# Patient Record
Sex: Male | Born: 1980 | Hispanic: Yes | Marital: Married | Smoking: Current some day smoker
Health system: Southern US, Community
[De-identification: ages and names within clinical notes are randomized; demographics above are authoritative.]

## PROBLEM LIST (undated history)

## (undated) DIAGNOSIS — E079 Disorder of thyroid, unspecified: Secondary | ICD-10-CM

## (undated) DIAGNOSIS — J45909 Unspecified asthma, uncomplicated: Secondary | ICD-10-CM

## (undated) HISTORY — DX: Disorder of thyroid, unspecified: E07.9

## (undated) HISTORY — PX: PARATHYROIDECTOMY: SHX19

## (undated) HISTORY — DX: Unspecified asthma, uncomplicated: J45.909

## (undated) HISTORY — PX: OTHER SURGICAL HISTORY: SHX169

---

## 2017-02-02 ENCOUNTER — Ambulatory Visit: Payer: Self-pay | Admitting: Family Medicine

## 2017-03-22 ENCOUNTER — Ambulatory Visit: Payer: Self-pay | Admitting: Family Medicine

## 2017-04-04 ENCOUNTER — Encounter: Payer: Self-pay | Admitting: Emergency Medicine

## 2017-04-04 ENCOUNTER — Other Ambulatory Visit: Payer: Self-pay

## 2017-04-04 ENCOUNTER — Emergency Department: Payer: BLUE CROSS/BLUE SHIELD

## 2017-04-04 ENCOUNTER — Emergency Department
Admission: EM | Admit: 2017-04-04 | Discharge: 2017-04-04 | Disposition: A | Discharge status: Home / Self Care | Payer: BLUE CROSS/BLUE SHIELD | Attending: Emergency Medicine | Admitting: Emergency Medicine

## 2017-04-04 ENCOUNTER — Telehealth: Payer: Self-pay | Admitting: Gastroenterology

## 2017-04-04 DIAGNOSIS — R1010 Upper abdominal pain, unspecified: Secondary | ICD-10-CM

## 2017-04-04 DIAGNOSIS — R509 Fever, unspecified: Secondary | ICD-10-CM | POA: Insufficient documentation

## 2017-04-04 DIAGNOSIS — K219 Gastro-esophageal reflux disease without esophagitis: Secondary | ICD-10-CM | POA: Diagnosis not present

## 2017-04-04 DIAGNOSIS — F17210 Nicotine dependence, cigarettes, uncomplicated: Secondary | ICD-10-CM | POA: Insufficient documentation

## 2017-04-04 DIAGNOSIS — R1013 Epigastric pain: Secondary | ICD-10-CM | POA: Insufficient documentation

## 2017-04-04 LAB — COMPREHENSIVE METABOLIC PANEL
ALT: 35 U/L (ref 17–63)
ANION GAP: 11 (ref 5–15)
AST: 25 U/L (ref 15–41)
Albumin: 4 g/dL (ref 3.5–5.0)
Alkaline Phosphatase: 59 U/L (ref 38–126)
BILIRUBIN TOTAL: 0.9 mg/dL (ref 0.3–1.2)
BUN: 14 mg/dL (ref 6–20)
CALCIUM: 8.7 mg/dL — AB (ref 8.9–10.3)
CO2: 22 mmol/L (ref 22–32)
Chloride: 103 mmol/L (ref 101–111)
Creatinine, Ser: 1 mg/dL (ref 0.61–1.24)
GFR calc non Af Amer: >60 mL/min (ref >60)
Glucose, Bld: 101 mg/dL — ABNORMAL HIGH (ref 65–99)
POTASSIUM: 3.7 mmol/L (ref 3.5–5.1)
SODIUM: 136 mmol/L (ref 135–145)
TOTAL PROTEIN: 7.4 g/dL (ref 6.5–8.1)

## 2017-04-04 LAB — CBC
HCT: 46.1 % (ref 40.0–52.0)
HEMOGLOBIN: 16 g/dL (ref 13.0–18.0)
MCH: 28 pg (ref 26.0–34.0)
MCHC: 34.6 g/dL (ref 32.0–36.0)
MCV: 81 fL (ref 80.0–100.0)
Platelets: 217 10*3/uL (ref 150–440)
RBC: 5.69 MIL/uL (ref 4.40–5.90)
RDW: 13.1 % (ref 11.5–14.5)
WBC: 5.2 10*3/uL (ref 3.8–10.6)

## 2017-04-04 LAB — TROPONIN I

## 2017-04-04 LAB — BILIRUBIN, DIRECT: BILIRUBIN DIRECT: 0.1 mg/dL (ref 0.1–0.5)

## 2017-04-04 LAB — LIPASE, BLOOD: Lipase: 35 U/L (ref 11–51)

## 2017-04-04 MED ORDER — MORPHINE SULFATE (PF) 4 MG/ML IV SOLN: 4.0000 mg | Freq: Once | INTRAVENOUS | Status: DC

## 2017-04-04 MED ORDER — IOPAMIDOL (ISOVUE-300) INJECTION 61%
125.0000 mL | Freq: Once | INTRAVENOUS | Status: AC | PRN
  Administered 2017-04-04: 125 mL via INTRAVENOUS

## 2017-04-04 MED ORDER — GI COCKTAIL ~~LOC~~
30.0000 mL | Freq: Once | ORAL | Status: AC
  Administered 2017-04-04: 30 mL via ORAL
  Filled 2017-04-04: qty 30

## 2017-04-04 MED ORDER — FAMOTIDINE IN NACL 20-0.9 MG/50ML-% IV SOLN
20.0000 mg | Freq: Once | INTRAVENOUS | Status: AC
  Administered 2017-04-04: 20 mg via INTRAVENOUS
  Filled 2017-04-04: qty 50

## 2017-04-04 MED ORDER — SODIUM CHLORIDE 0.9 % IV SOLN
Freq: Once | INTRAVENOUS | Status: AC
  Administered 2017-04-04: 10:00:00 via INTRAVENOUS

## 2017-04-04 MED ORDER — FAMOTIDINE 20 MG PO TABS: 20.0000 mg | ORAL_TABLET | Freq: Two times a day (BID) | ORAL | 1 refills | Status: DC

## 2017-04-04 MED ORDER — MORPHINE SULFATE (PF) 4 MG/ML IV SOLN
4.0000 mg | Freq: Once | INTRAVENOUS | Status: AC
  Administered 2017-04-04: 4 mg via INTRAVENOUS
  Filled 2017-04-04: qty 1

## 2017-04-04 MED ORDER — IOPAMIDOL (ISOVUE-300) INJECTION 61%
30.0000 mL | Freq: Once | INTRAVENOUS | Status: AC | PRN
  Administered 2017-04-04: 30 mL via ORAL

## 2017-04-04 MED ORDER — ONDANSETRON HCL 4 MG/2ML IJ SOLN
4.0000 mg | Freq: Once | INTRAMUSCULAR | Status: AC
Start: 2017-04-04 — End: 2017-04-04
  Administered 2017-04-04: 4 mg via INTRAVENOUS
  Filled 2017-04-04: qty 2

## 2017-04-04 MED ORDER — OXYCODONE-ACETAMINOPHEN 5-325 MG PO TABS: 1.0000 | ORAL_TABLET | Freq: Four times a day (QID) | ORAL | 0 refills | Status: DC | PRN

## 2017-04-04 NOTE — ED Provider Notes (Signed)
[facility] Emergency Department Provider Note       Time seen: ----------------------------------------- 9:50 AM on 04/04/2017 -----------------------------------------   I have reviewed the triage vital signs and the nursing notes.  HISTORY   Chief Complaint Abdominal Pain and Fever    HPI Dennis Gardner is a 37 y.o. male with a history of parathyroid excision who presents to the ED for gastric pain since yesterday.  Patient reports he had fever several days ago for which she was taking ibuprofen.  Temperature was noted to be up to 104 but it did not last very long.  It seemed to improve with oral antipyretics.  He began having epigastric pain yesterday.  Eating and drinking seems to make it worse.  He denies any upper respiratory symptoms, he denies any vomiting or diarrhea but does have some nausea.  History reviewed. No pertinent past medical history.  There are no active problems to display for this patient.   Past Surgical History:  Procedure Laterality Date  . parathyroid excision      Allergies Patient has no known allergies.  Social History Social History   Tobacco Use  . Smoking status: Current Some Day Smoker  Substance Use Topics  . Alcohol use: Not on file  . Drug use: Not on file    Review of Systems Constitutional: Positive for fever Cardiovascular: Negative for chest pain. Respiratory: Negative for shortness of breath. Gastrointestinal: Positive for abdominal pain, nausea Genitourinary: Negative for dysuria. Musculoskeletal: Negative for back pain. Skin: Negative for rash. Neurological: Negative for headaches, focal weakness or numbness.  All systems negative/normal/unremarkable except as stated in the HPI  ____________________________________________   PHYSICAL EXAM:  VITAL SIGNS: ED Triage Vitals  Enc Vitals Group     BP 04/04/17 0853 135/77     Pulse Rate 04/04/17 0853 86     Resp 04/04/17 0853 20     Temp  04/04/17 0853 99.2 F (37.3 C)     Temp Source 04/04/17 0853 Axillary     SpO2 04/04/17 0853 98 %     Weight 04/04/17 0853 278 lb (126.1 kg)     Height 04/04/17 0853 6\' 2"  (1.88 m)     Head Circumference --      Peak Flow --      Pain Score 04/04/17 0858 7     Pain Loc --      Pain Edu? --      Excl. in GC? --     Constitutional: Alert and oriented. Well appearing and in no distress. Eyes: Conjunctivae are normal. Normal extraocular movements. ENT   Head: Normocephalic and atraumatic.   Nose: No congestion/rhinnorhea.   Mouth/Throat: Mucous membranes are moist.   Neck: No stridor. Cardiovascular: Normal rate, regular rhythm. No murmurs, rubs, or gallops. Respiratory: Normal respiratory effort without tachypnea nor retractions. Breath sounds are clear and equal bilaterally. No wheezes/rales/rhonchi. Gastrointestinal: Epigastric tenderness, no rebound or guarding.  Normal bowel sounds. Musculoskeletal: Nontender with normal range of motion in extremities. No lower extremity tenderness nor edema. Neurologic:  Normal speech and language. No gross focal neurologic deficits are appreciated.  Skin:  Skin is warm, dry and intact. No rash noted. Psychiatric: Mood and affect are normal. Speech and behavior are normal.  ____________________________________________  ED COURSE:  As part of my medical decision making, I reviewed the following data within the electronic MEDICAL RECORD NUMBER History obtained from family if available, nursing notes, old chart and ekg, as well as notes from prior ED  visits. Patient presented for epigastric pain and recent fever, we will assess with labs and imaging as indicated at this time.   Procedures ____________________________________________   LABS (pertinent positives/negatives)  Labs Reviewed  COMPREHENSIVE METABOLIC PANEL - Abnormal; Notable for the following components:      Result Value   Glucose, Bld 101 (*)    Calcium 8.7 (*)    All  other components within normal limits  LIPASE, BLOOD  CBC  TROPONIN I  BILIRUBIN, DIRECT    RADIOLOGY Images were viewed by me  Abdomen 2 view/CT IMPRESSION: Nonspecific bowel gas pattern with a few mildly prominent central small bowel loops. This could be related to focal ileus or early small bowel obstruction.  IMPRESSION: Mild dilatation of the right ureter and right renal pelvis of uncertain etiology. Subtle density near the right ureterovesical junction but no definite stone.  Normal appendix. No acute bowel abnormality. ____________________________________________  DIFFERENTIAL DIAGNOSIS   GERD, peptic ulcer disease, gastroenteritis, influenza, dehydration  FINAL ASSESSMENT AND PLAN  Epigastric pain   Plan: Patient had presented for epigastric pain. Patient's labs were reassuring. Patient's imaging revealed possible recently passed kidney stone.  Patient has exhibited viral symptoms similar to influenza but recently was tested negative for this.  For his fever and symptoms he has taken NSAIDs which I think is exacerbating his GERD.  He was given a GI cocktail here as well as Pepcid, fluids and morphine.  No other etiology was identified on CT.  He does not have symptoms of a kidney stone.  Overall he is improved and stable for outpatient follow-up.   Ulice DashJohnathan E Emmogene Simson, MD   Note: This note was generated in part or whole with voice recognition software. Voice recognition is usually quite accurate but there are transcription errors that can and very often do occur. I apologize for any typographical errors that were not detected and corrected.     Emily FilbertWilliams, Raheen Capili E, MD 04/04/17 [phone]

## 2017-04-04 NOTE — Telephone Encounter (Signed)
Patient called to schedule an appointment with Dr Tobi BastosAnna being referred by the ED. He was offered  04-20-17 & declined. He stated this would not do him any good. I went by what the ED note stated.

## 2017-04-04 NOTE — ED Notes (Signed)
Patient reports not wanting any pain medicine at this time

## 2017-04-04 NOTE — ED Notes (Signed)
Patient transported to X-ray 

## 2017-04-04 NOTE — ED Notes (Signed)
Patient was seen at urgent care and tested negative for flu and unremarkable chest xray

## 2017-04-04 NOTE — ED Triage Notes (Signed)
Epigastric pain since yesterday. Fever x 2 days, temp 104.7 Saturday. Screen flu neg 2 days ago.

## 2017-04-06 ENCOUNTER — Emergency Department
Admission: EM | Admit: 2017-04-06 | Discharge: 2017-04-06 | Disposition: A | Discharge status: Home / Self Care | Payer: BLUE CROSS/BLUE SHIELD | Attending: Emergency Medicine | Admitting: Emergency Medicine

## 2017-04-06 ENCOUNTER — Encounter: Payer: Self-pay | Admitting: Emergency Medicine

## 2017-04-06 ENCOUNTER — Other Ambulatory Visit: Payer: Self-pay

## 2017-04-06 DIAGNOSIS — K922 Gastrointestinal hemorrhage, unspecified: Secondary | ICD-10-CM

## 2017-04-06 DIAGNOSIS — R1013 Epigastric pain: Secondary | ICD-10-CM

## 2017-04-06 DIAGNOSIS — F17210 Nicotine dependence, cigarettes, uncomplicated: Secondary | ICD-10-CM | POA: Diagnosis not present

## 2017-04-06 DIAGNOSIS — Z79899 Other long term (current) drug therapy: Secondary | ICD-10-CM | POA: Insufficient documentation

## 2017-04-06 LAB — COMPREHENSIVE METABOLIC PANEL
ALT: 55 U/L (ref 17–63)
ANION GAP: 10 (ref 5–15)
AST: 39 U/L (ref 15–41)
Albumin: 4.3 g/dL (ref 3.5–5.0)
Alkaline Phosphatase: 60 U/L (ref 38–126)
BUN: 13 mg/dL (ref 6–20)
CALCIUM: 9.3 mg/dL (ref 8.9–10.3)
CHLORIDE: 101 mmol/L (ref 101–111)
CO2: 26 mmol/L (ref 22–32)
Creatinine, Ser: 1 mg/dL (ref 0.61–1.24)
GFR calc non Af Amer: >60 mL/min (ref >60)
Glucose, Bld: 95 mg/dL (ref 65–99)
POTASSIUM: 3.9 mmol/L (ref 3.5–5.1)
SODIUM: 137 mmol/L (ref 135–145)
Total Bilirubin: 0.8 mg/dL (ref 0.3–1.2)
Total Protein: 7.6 g/dL (ref 6.5–8.1)

## 2017-04-06 LAB — CBC
HCT: 44.2 % (ref 40.0–52.0)
HEMOGLOBIN: 15.2 g/dL (ref 13.0–18.0)
MCH: 27.9 pg (ref 26.0–34.0)
MCHC: 34.5 g/dL (ref 32.0–36.0)
MCV: 81 fL (ref 80.0–100.0)
Platelets: 228 10*3/uL (ref 150–440)
RBC: 5.46 MIL/uL (ref 4.40–5.90)
RDW: 13.3 % (ref 11.5–14.5)
WBC: 5.4 10*3/uL (ref 3.8–10.6)

## 2017-04-06 LAB — TYPE AND SCREEN
ABO/RH(D): O POS
Antibody Screen: NEGATIVE

## 2017-04-06 MED ORDER — PANTOPRAZOLE SODIUM 40 MG PO TBEC: 40.0000 mg | DELAYED_RELEASE_TABLET | Freq: Every day | ORAL | 0 refills | Status: DC

## 2017-04-06 NOTE — ED Provider Notes (Signed)
[facility] Emergency Department Provider Note  ____________________________________________   First MD Initiated Contact with Patient 04/06/17 1551     (approximate)  I have reviewed the triage vital signs and the nursing notes.   HISTORY  Chief Complaint Melena   HPI Dennis Gardner is a 37 y.o. male is self presents the emergency department with roughly 4-5 days of cramping moderate severity upper abdominal pain associated with nausea.  He was seen in our emergency department 2 days ago where he had unremarkable blood work and a normal CT scan.  His symptoms were felt to be gastric in etiology and he was prescribed famotidine and given GI follow-up.  The patient notes that his famotidine is not helped.  He is frustrated and today he had a small amount of dark black stool.  His GI follow-up is not for another 2 weeks.  His pain is cramping upper abdominal.  Seems to be somewhat worse after food somewhat improved when not eating.  Is associated with nausea.  It is nonradiating.  History reviewed. No pertinent past medical history.  There are no active problems to display for this patient.   Past Surgical History:  Procedure Laterality Date  . parathyroid excision    . PARATHYROIDECTOMY      Prior to Admission medications   Medication Sig Start Date End Date Taking? Authorizing Provider  acetaminophen (TYLENOL) 325 MG tablet Take 650 mg by mouth every 6 (six) hours as needed.    [provider]  famotidine (PEPCID) 20 MG tablet Take 1 tablet (20 mg total) by mouth 2 (two) times daily. 04/04/17   Emily Filbert, MD  oseltamivir (TAMIFLU) 75 MG capsule Take 1 capsule by mouth 2 (two) times daily. 04/03/17 04/08/17  [provider]  oxyCODONE-acetaminophen (PERCOCET) 5-325 MG tablet Take 1-2 tablets by mouth every 6 (six) hours as needed. 04/04/17   Emily Filbert, MD  pantoprazole (PROTONIX) 40 MG tablet Take 1 tablet (40 mg total) by mouth  daily. 04/06/17 04/06/18  Merrily Brittle, MD    Allergies Patient has no known allergies.  History reviewed. No pertinent family history.  Social History Social History   Tobacco Use  . Smoking status: Current Some Day Smoker  . Smokeless tobacco: Never Used  Substance Use Topics  . Alcohol use: Yes    Frequency: Never  . Drug use: No    Review of Systems Constitutional: No fever/chills Eyes: No visual changes. ENT: No sore throat. Cardiovascular: Denies chest pain. Respiratory: Denies shortness of breath. Gastrointestinal: Positive for abdominal pain.  Positive for nausea, no vomiting.  No diarrhea.  No constipation. Genitourinary: Negative for dysuria. Musculoskeletal: Negative for back pain. Skin: Negative for rash. Neurological: Negative for headaches, focal weakness or numbness.   ____________________________________________   PHYSICAL EXAM:  VITAL SIGNS: ED Triage Vitals  Enc Vitals Group     BP 04/06/17 1445 137/83     Pulse Rate 04/06/17 1444 69     Resp 04/06/17 1444 18     Temp 04/06/17 1444 98.7 F (37.1 C)     Temp Source 04/06/17 1444 Oral     SpO2 04/06/17 1444 98 %     Weight 04/06/17 1445 278 lb (126.1 kg)     Height 04/06/17 1445 6\' 2"  (1.88 m)     Head Circumference --      Peak Flow --      Pain Score 04/06/17 1444 6     Pain Loc --  Pain Edu? --      Excl. in GC? --     Constitutional: Alert and oriented x4 somewhat anxious appearing nontoxic no diaphoresis speaks in full clear sentences Eyes: PERRL EOMI. Head: Atraumatic. Nose: No congestion/rhinnorhea. Mouth/Throat: No trismus Neck: No stridor.   Cardiovascular: Normal rate, regular rhythm. Grossly normal heart sounds.  Good peripheral circulation. Respiratory: Normal respiratory effort.  No retractions. Lungs CTAB and moving good air Gastrointestinal: Soft nondistended nontender no rebound or guarding no peritonitis no McBurney's tenderness negative Rovsing's no costovertebral  tenderness Musculoskeletal: No lower extremity edema   Neurologic:  Normal speech and language. No gross focal neurologic deficits are appreciated. Skin:  Skin is warm, dry and intact. No rash noted. Psychiatric: Somewhat anxious appearing   ____________________________________________   DIFFERENTIAL includes but not limited to  Gastritis, gastric reflux, esophagitis, gastric ulcer, duodenal ulcer ____________________________________________   LABS (all labs ordered are listed, but only abnormal results are displayed)  Labs Reviewed  COMPREHENSIVE METABOLIC PANEL  CBC  TYPE AND SCREEN    Lab work reviewed by me with no acute disease __________________________________________  EKG   ____________________________________________  RADIOLOGY   ____________________________________________   PROCEDURES  Procedure(s) performed: no  Procedures  Critical Care performed: no  Observation: no ____________________________________________   INITIAL IMPRESSION / ASSESSMENT AND PLAN / ED COURSE  Pertinent labs & imaging results that were available during my care of the patient were reviewed by me and considered in my medical decision making (see chart for details).  The patient is somewhat anxious appearing.  His lab work is unremarkable.  His abdominal exam is benign.  I had a lengthy discussion with the patient regarding his symptoms and that the likely due still represent gastric etiology.  If H2 blockers are not adequate we will increase him to a PPI.  Have encouraged him to establish care with primary care as well as gastroenterology.  He is discharged home in stable condition verbalizes understanding and agree with plan.      ____________________________________________   FINAL CLINICAL IMPRESSION(S) / ED DIAGNOSES  Final diagnoses:  Epigastric pain  Upper GI bleed      NEW MEDICATIONS STARTED DURING THIS VISIT:  Discharge Medication List as of 04/06/2017   4:09 PM    START taking these medications   Details  pantoprazole (PROTONIX) 40 MG tablet Take 1 tablet (40 mg total) by mouth daily., Starting Wed 04/06/2017, Until Thu 04/06/2018, Print         Note:  This document was prepared using Dragon voice recognition software and may include unintentional dictation errors.     Merrily Brittleifenbark, Allyah Heather, MD 04/06/17 2001

## 2017-04-06 NOTE — Discharge Instructions (Signed)
These continue taking your famotidine as prescribed and add on Protonix once a day.  Keep your follow-up with the GI doctor as scheduled and return to the emergency department sooner for any concerns.  It was a pleasure to take care of you today, and thank you for coming to our emergency department.  If you have any questions or concerns before leaving please ask the nurse to grab me and I'm more than happy to go through your aftercare instructions again.  If you were prescribed any opioid pain medication today such as Norco, Vicodin, Percocet, morphine, hydrocodone, or oxycodone please make sure you do not drive when you are taking this medication as it can alter your ability to drive safely.  If you have any concerns once you are home that you are not improving or are in fact getting worse before you can make it to your follow-up appointment, please do not hesitate to call 911 and come back for further evaluation.  Merrily Brittle, MD  Results for orders placed or performed during the hospital encounter of 04/06/17  Comprehensive metabolic panel  Result Value Ref Range   Sodium 137 135 - 145 mmol/L   Potassium 3.9 3.5 - 5.1 mmol/L   Chloride 101 101 - 111 mmol/L   CO2 26 22 - 32 mmol/L   Glucose, Bld 95 65 - 99 mg/dL   BUN 13 6 - 20 mg/dL   Creatinine, Ser 0.98 0.61 - 1.24 mg/dL   Calcium 9.3 8.9 - 11.9 mg/dL   Total Protein 7.6 6.5 - 8.1 g/dL   Albumin 4.3 3.5 - 5.0 g/dL   AST 39 15 - 41 U/L   ALT 55 17 - 63 U/L   Alkaline Phosphatase 60 38 - 126 U/L   Total Bilirubin 0.8 0.3 - 1.2 mg/dL   GFR calc non Af Amer >60 >60 mL/min   GFR calc Af Amer >60 >60 mL/min   Anion gap 10 5 - 15  CBC  Result Value Ref Range   WBC 5.4 3.8 - 10.6 K/uL   RBC 5.46 4.40 - 5.90 MIL/uL   Hemoglobin 15.2 13.0 - 18.0 g/dL   HCT 14.7 82.9 - 56.2 %   MCV 81.0 80.0 - 100.0 fL   MCH 27.9 26.0 - 34.0 pg   MCHC 34.5 32.0 - 36.0 g/dL   RDW 13.0 86.5 - 78.4 %   Platelets 228 150 - 440 K/uL  Type and screen  [facility]  Result Value Ref Range   ABO/RH(D) PENDING    Antibody Screen PENDING    Sample Expiration      04/09/2017 Performed at [facility], [address], [place], [place] [address]    Ct Abdomen Pelvis W Contrast  Result Date: 04/04/2017 CLINICAL DATA:  37 year old with nausea, vomiting and epigastric pain. EXAM: CT ABDOMEN AND PELVIS WITH CONTRAST TECHNIQUE: Multidetector CT imaging of the abdomen and pelvis was performed using the standard protocol following bolus administration of intravenous contrast. CONTRAST:  ISOVUE-300 IOPAMIDOL (ISOVUE-300) INJECTION 61% COMPARISON:  None. FINDINGS: Lower chest: Lung bases are clear.  No pleural effusions. Hepatobiliary: Normal appearance of the liver, gallbladder and portal venous system. Pancreas: Normal appearance of the pancreas without inflammation or duct dilatation. Spleen: Normal appearance of spleen without enlargement. Adrenals/Urinary Tract: Normal adrenal glands. Mild dilatation of the right ureter. Mild dilatation of the right renal pelvis which could be related to an extrarenal pelvis. Subtle density near the right ureterovesical junction but a stone  is not confidently identified. Urinary bladder is mildly distended. Normal appearance of the left ureter and no evidence for left hydronephrosis. No suspicious renal lesions. No perinephric edema or stranding. Stomach/Bowel: Stomach is mildly distended. Normal appearance of the small bowel. Appendix is normal. No acute abnormality to the colon. No evidence for bowel inflammation or obstruction. Vascular/Lymphatic: No significant vascular findings are present. No enlarged abdominal or pelvic lymph nodes. Reproductive: Prostate is unremarkable. Other: No ascites.  No free air. Musculoskeletal: No acute bone abnormality. IMPRESSION: Mild dilatation of the right ureter and right renal pelvis of uncertain etiology. Subtle density near the right ureterovesical  junction but no definite stone. Normal appendix.  No acute bowel abnormality. Electronically Signed   By: Richarda OverlieAdam  Henn M.D.   On: 04/04/2017 11:29   Dg Abd 2 Views  Result Date: 04/04/2017 CLINICAL DATA:  Abdominal pain EXAM: ABDOMEN - 2 VIEW COMPARISON:  None. FINDINGS: A few mildly prominent central small bowel loops. Gas and stool noted throughout the colon. No free air organomegaly. No suspicious calcification. IMPRESSION: Nonspecific bowel gas pattern with a few mildly prominent central small bowel loops. This could be related to focal ileus or early small bowel obstruction. Electronically Signed   By: Charlett NoseKevin  Dover M.D.   On: 04/04/2017 10:10

## 2017-04-06 NOTE — ED Triage Notes (Signed)
Pt here for continued upper abdominal pain.  Was seen 2 days ago.  Was told has stomach ulcer but cannot get in with GI for 2 weeks yet.  Started last night with dark tarry looking stools.  No vomiting but has had some nausea.

## 2017-04-26 ENCOUNTER — Ambulatory Visit: Payer: BLUE CROSS/BLUE SHIELD | Admitting: Gastroenterology

## 2017-04-26 ENCOUNTER — Encounter: Payer: Self-pay | Admitting: Gastroenterology

## 2017-05-17 ENCOUNTER — Telehealth: Payer: Self-pay | Admitting: Gastroenterology

## 2017-05-17 NOTE — Telephone Encounter (Signed)
PT LEFT VM TO SET UP APT, ATTEMPTED TO RETURN THE CALL AND LEFT MESSAGE FOR HIM TO CALL OFFICE AND SCHEDULE ED FU FOR REFLUX

## 2017-06-13 ENCOUNTER — Encounter: Payer: Self-pay | Admitting: Gastroenterology

## 2017-06-13 ENCOUNTER — Ambulatory Visit: Payer: BLUE CROSS/BLUE SHIELD | Admitting: Gastroenterology

## 2017-06-13 VITALS — BP 119/81 | HR 80 | Ht 74.0 in | Wt 275.0 lb

## 2017-06-13 DIAGNOSIS — K921 Melena: Secondary | ICD-10-CM

## 2017-06-13 DIAGNOSIS — R194 Change in bowel habit: Secondary | ICD-10-CM | POA: Diagnosis not present

## 2017-06-13 NOTE — Progress Notes (Signed)
Wyline Mood MD, MRCP(U.K) [address]  [address]  [place], [place] [address]  Main: [phone]  Fax: [phone]   Gastroenterology Consultation  Referring Provider: ER Primary Care Physician:  Patient, No Pcp Per Primary Gastroenterologist:  Dr. Wyline Mood  Reason for Consultation:     Reflux         HPI:   Dennis Gardner is a 37 y.o. y/o male referred for reflux. He was seen at the ER on 04/04/17 with epigastric pain while on NSAID's,  CT abdomen showed no acute bowel abnormality .  He was seen again in the ER on 04/06/17 for abdominal pain.   Says presently has no pain. Main issue he wishes to see me today is for bowel movements, loose and crampy, ongoing since 03/2017 . He has 0-3 bowel movements a day , consistency of guacamole. No blood. When he went to the ER says he had noticed blood in his stool. Blood was mixed in his stool.Presently not on any NSAID's. He does have some abdominal cramping. Relieved after a bowel movement . Consumes splenda 1 sachet every other day , no sodas or chewing gum . No laxatives. Denies any weight loss.     History reviewed. No pertinent past medical history.  Past Surgical History:  Procedure Laterality Date  . parathyroid excision    . PARATHYROIDECTOMY      Prior to Admission medications   Medication Sig Start Date End Date Taking? Authorizing Provider  acetaminophen (TYLENOL) 325 MG tablet Take 650 mg by mouth every 6 (six) hours as needed.   Yes [provider]  ibuprofen (ADVIL) 200 MG tablet Take by mouth.   Yes [provider]    History reviewed. No pertinent family history.   Social History   Tobacco Use  . Smoking status: Current Some Day Smoker  . Smokeless tobacco: Never Used  Substance Use Topics  . Alcohol use: Yes    Frequency: Never  . Drug use: No    Allergies as of 06/13/2017  . (No Known Allergies)    Review of Systems:    All systems reviewed and negative except where noted in HPI.   Physical Exam:  BP 119/81 (BP Location: Right Arm, Patient Position: Sitting, Cuff Size: Large)   Pulse 80   Ht 6\' 2"  (1.88 m)   Wt 275 lb (124.7 kg)   BMI 35.31 kg/m  No LMP for male patient. Psych:  Alert and cooperative. Normal mood and affect. General:   Alert,  Well-developed, well-nourished, pleasant and cooperative in NAD Head:  Normocephalic and atraumatic. Eyes:  Sclera clear, no icterus.   Conjunctiva pink. Ears:  Normal auditory acuity. Nose:  No deformity, discharge, or lesions. Mouth:  No deformity or lesions,oropharynx pink & moist. Neck:  Supple; no masses or thyromegaly. Lungs:  Respirations even and unlabored.  Clear throughout to auscultation.   No wheezes, crackles, or rhonchi. No acute distress. Heart:  Regular rate and rhythm; no murmurs, clicks, rubs, or gallops. Abdomen:  Normal bowel sounds.  No bruits.  Soft, non-tender and non-distended without masses, hepatosplenomegaly or hernias noted.  No guarding or rebound tenderness.    Neurologic:  Alert and oriented x3;  grossly normal neurologically. Skin:  Intact without significant lesions or rashes. No jaundice. Lymph Nodes:  No significant cervical adenopathy. Psych:  Alert and cooperative. Normal mood and affect.  Imaging Studies: No results found.  Assessment and Plan:   Dennis Gardner is a 37 y.o. y/o male  has been referred for GERD but he says he is here to see me for change in bowel habits and some blood in his stool. May likely have IBS-D .   Plan  1. Colonoscopy  2. Stop Splenda  3. Celiac serology    I have discussed alternative options, risks & benefits,  which include, but are not limited to, bleeding, infection, perforation,respiratory complication & drug reaction.  The patient agrees with this plan & written consent will be obtained.     Follow up in 6-8 weeks   Dr Wyline MoodKiran Kariana Wiles MD,MRCP(U.K)

## 2017-06-14 MED ORDER — PEG 3350-KCL-NA BICARB-NACL 420 G PO SOLR: 4000.0000 mL | Freq: Once | ORAL | 0 refills | Status: AC

## 2017-06-14 NOTE — Addendum Note (Signed)
Addended by: Ardyth ManARTER, Feliciano Wynter Z on: 06/14/2017 10:45 AM   Modules accepted: Orders, SmartSet

## 2017-06-24 ENCOUNTER — Ambulatory Visit: Payer: BLUE CROSS/BLUE SHIELD | Admitting: Anesthesiology

## 2017-06-24 ENCOUNTER — Ambulatory Visit
Admission: RE | Admit: 2017-06-24 | Discharge: 2017-06-24 | Disposition: A | Discharge status: Home / Self Care | Payer: BLUE CROSS/BLUE SHIELD | Source: Ambulatory Visit | Attending: Gastroenterology | Admitting: Gastroenterology

## 2017-06-24 ENCOUNTER — Encounter
Admission: RE | Disposition: A | Discharge status: Home / Self Care | Payer: Self-pay | Source: Ambulatory Visit | Attending: Gastroenterology

## 2017-06-24 DIAGNOSIS — R194 Change in bowel habit: Secondary | ICD-10-CM

## 2017-06-24 DIAGNOSIS — F17200 Nicotine dependence, unspecified, uncomplicated: Secondary | ICD-10-CM | POA: Insufficient documentation

## 2017-06-24 DIAGNOSIS — K640 First degree hemorrhoids: Secondary | ICD-10-CM | POA: Diagnosis not present

## 2017-06-24 DIAGNOSIS — K921 Melena: Secondary | ICD-10-CM | POA: Diagnosis present

## 2017-06-24 DIAGNOSIS — Z6834 Body mass index (BMI) 34.0-34.9, adult: Secondary | ICD-10-CM | POA: Insufficient documentation

## 2017-06-24 HISTORY — PX: COLONOSCOPY WITH PROPOFOL: SHX5780

## 2017-06-24 SURGERY — COLONOSCOPY WITH PROPOFOL
Anesthesia: General

## 2017-06-24 MED ORDER — LIDOCAINE HCL (PF) 2 % IJ SOLN
INTRAMUSCULAR | Status: AC
  Filled 2017-06-24: qty 30

## 2017-06-24 MED ORDER — MIDAZOLAM HCL 2 MG/2ML IJ SOLN
INTRAMUSCULAR | Status: DC | PRN
  Administered 2017-06-24: 2 mg via INTRAVENOUS

## 2017-06-24 MED ORDER — PROPOFOL 10 MG/ML IV BOLUS
INTRAVENOUS | Status: DC | PRN
  Administered 2017-06-24: 50 mg via INTRAVENOUS
  Administered 2017-06-24: 80 mg via INTRAVENOUS
  Administered 2017-06-24: 40 mg via INTRAVENOUS
  Administered 2017-06-24: 50 mg via INTRAVENOUS

## 2017-06-24 MED ORDER — MIDAZOLAM HCL 2 MG/2ML IJ SOLN
INTRAMUSCULAR | Status: AC
Start: 2017-06-24 — End: ?
  Filled 2017-06-24: qty 2

## 2017-06-24 MED ORDER — PROPOFOL 500 MG/50ML IV EMUL
INTRAVENOUS | Status: DC | PRN
  Administered 2017-06-24: 150 ug/kg/min via INTRAVENOUS

## 2017-06-24 MED ORDER — SODIUM CHLORIDE 0.9 % IV SOLN
INTRAVENOUS | Status: DC
  Administered 2017-06-24: 1000 mL via INTRAVENOUS

## 2017-06-24 NOTE — H&P (Signed)
Wyline MoodKiran Karsen Nakanishi, MD [address], [address], [place], [place], [address] [address], [address], [place], [place], [address] Phone: [phone]  Fax: [phone]  Primary Care Physician:  Patient, No Pcp Per   Pre-Procedure History & Physical: HPI:  Dennis Cowboylex Bessire is a 37 y.o. male is here for an colonoscopy.   No past medical history on file.  Past Surgical History:  Procedure Laterality Date  . parathyroid excision    . PARATHYROIDECTOMY      Prior to Admission medications   Medication Sig Start Date End Date Taking? Authorizing Provider  acetaminophen (TYLENOL) 325 MG tablet Take 650 mg by mouth every 6 (six) hours as needed.   Yes [provider]  ibuprofen (ADVIL) 200 MG tablet Take by mouth.   Yes [provider]    Allergies as of 06/14/2017  . (No Known Allergies)    No family history on file.  Social History   Socioeconomic History  . Marital status: Married    Spouse name: Not on file  . Number of children: Not on file  . Years of education: Not on file  . Highest education level: Not on file  Occupational History  . Not on file  Social Needs  . Financial resource strain: Not on file  . Food insecurity:    Worry: Not on file    Inability: Not on file  . Transportation needs:    Medical: Not on file    Non-medical: Not on file  Tobacco Use  . Smoking status: Current Some Day Smoker  . Smokeless tobacco: Never Used  Substance and Sexual Activity  . Alcohol use: Yes    Frequency: Never  . Drug use: No  . Sexual activity: Yes  Lifestyle  . Physical activity:    Days per week: Not on file    Minutes per session: Not on file  . Stress: Not on file  Relationships  . Social connections:    Talks on phone: Not on file    Gets together: Not on file    Attends religious service: Not on file    Active member of club or organization: Not on file    Attends meetings of clubs or organizations: Not on file    Relationship status: Not  on file  . Intimate partner violence:    Fear of current or ex partner: Not on file    Emotionally abused: Not on file    Physically abused: Not on file    Forced sexual activity: Not on file  Other Topics Concern  . Not on file  Social History Narrative  . Not on file    Review of Systems: See HPI, otherwise negative ROS  Physical Exam: BP 124/85   Pulse 64   Temp (!) 96.1 F (35.6 C) (Tympanic)   Resp 20   Ht 6\' 2"  (1.88 m)   Wt 269 lb (122 kg)   SpO2 99%   BMI 34.54 kg/m  General:   Alert,  pleasant and cooperative in NAD Head:  Normocephalic and atraumatic. Neck:  Supple; no masses or thyromegaly. Lungs:  Clear throughout to auscultation, normal respiratory effort.    Heart:  +S1, +S2, Regular rate and rhythm, No edema. Abdomen:  Soft, nontender and nondistended. Normal bowel sounds, without guarding, and without rebound.   Neurologic:  Alert and  oriented x4;  grossly normal neurologically.  Impression/Plan: Dennis Gardner is here for an colonoscopy to be performed for blood in the  stool  Risks, benefits, limitations, and alternatives regarding  colonoscopy have been reviewed with the patient.  Questions have been answered.  All parties agreeable.   Wyline Mood, MD  06/24/2017, 10:19 AM

## 2017-06-24 NOTE — Transfer of Care (Signed)
Immediate Anesthesia Transfer of Care Note  Patient: Dennis Gardner  Procedure(s) Performed: COLONOSCOPY WITH PROPOFOL (N/A )  Patient Location: PACU and Endoscopy Unit  Anesthesia Type:General  Level of Consciousness: drowsy and patient cooperative  Airway & Oxygen Therapy: Patient Spontanous Breathing  Post-op Assessment: Report given to RN, Post -op Vital signs reviewed and stable and Patient moving all extremities  Post vital signs: Reviewed and stable  Last Vitals:  Vitals Value Taken Time  BP 111/68 06/24/2017 11:30 AM  Temp 36.1 C 06/24/2017 11:29 AM  Pulse 64 06/24/2017 11:30 AM  Resp 22 06/24/2017 11:30 AM  SpO2 97 % 06/24/2017 11:30 AM  Vitals shown include unvalidated device data.  Last Pain:  Vitals:   06/24/17 1129  TempSrc: Tympanic  PainSc: Asleep         Complications: No apparent anesthesia complications

## 2017-06-24 NOTE — Anesthesia Post-op Follow-up Note (Signed)
Anesthesia QCDR form completed.        

## 2017-06-24 NOTE — Anesthesia Preprocedure Evaluation (Signed)
Anesthesia Evaluation  Patient identified by MRN, date of birth, ID band Patient awake    Reviewed: Allergy & Precautions, H&P , NPO status , Patient's Chart, lab work & pertinent test results, reviewed documented beta blocker date and time   Airway Mallampati: II   Neck ROM: full    Dental  (+) Poor Dentition   Pulmonary neg pulmonary ROS, Current Smoker,    Pulmonary exam normal        Cardiovascular negative cardio ROS Normal cardiovascular exam Rhythm:regular Rate:Normal     Neuro/Psych negative neurological ROS  negative psych ROS   GI/Hepatic negative GI ROS, Neg liver ROS,   Endo/Other  negative endocrine ROSMorbid obesity  Renal/GU negative Renal ROS  negative genitourinary   Musculoskeletal   Abdominal   Peds  Hematology negative hematology ROS (+)   Anesthesia Other Findings No past medical history on file. Past Surgical History: No date: parathyroid excision No date: PARATHYROIDECTOMY BMI    Body Mass Index:  34.54 kg/m     Reproductive/Obstetrics negative OB ROS                             Anesthesia Physical Anesthesia Plan  ASA: II  Anesthesia Plan: General   Post-op Pain Management:    Induction:   PONV Risk Score and Plan:   Airway Management Planned:   Additional Equipment:   Intra-op Plan:   Post-operative Plan:   Informed Consent: I have reviewed the patients History and Physical, chart, labs and discussed the procedure including the risks, benefits and alternatives for the proposed anesthesia with the patient or authorized representative who has indicated his/her understanding and acceptance.   Dental Advisory Given  Plan Discussed with: CRNA  Anesthesia Plan Comments:         Anesthesia Quick Evaluation

## 2017-06-24 NOTE — Op Note (Signed)
[facility] Gastroenterology Patient Name: Michiel Cowboylex Lawley Procedure Date: 06/24/2017 10:54 AM MRN: [id number] Account #: [id number] Date of Birth: 07-Jul-1980 Admit Type: Outpatient Age: 37 [facility] 4 Gender: Male Note Status: Finalized Procedure:            Colonoscopy Indications:          Rectal bleeding Providers:            Wyline MoodKiran Brylan Dec MD, MD Referring MD:         No Local Md, MD (Referring MD) Medicines:            Monitored Anesthesia Care Complications:        No immediate complications. Procedure:            Pre-Anesthesia Assessment:                       - Prior to the procedure, a History and Physical was                        performed, and patient medications, allergies and                        sensitivities were reviewed. The patient's tolerance of                        previous anesthesia was reviewed.                       - The risks and benefits of the procedure and the                        sedation options and risks were discussed with the                        patient. All questions were answered and informed                        consent was obtained.                       - ASA Grade Assessment: II - A patient with mild                        systemic disease.                       After obtaining informed consent, the colonoscope was                        passed under direct vision. Throughout the procedure,                        the patient's blood pressure, pulse, and oxygen                        saturations were monitored continuously. The Olympus                        CF-H180AL colonoscope ( S#: [id number] ) was introduced  through the anus and advanced to the the cecum,                        identified by the appendiceal orifice, IC valve and                        transillumination. The colonoscopy was performed with                        ease. The patient tolerated the procedure well. The                      quality of the bowel preparation was fair. Findings:      Non-bleeding internal hemorrhoids were found during retroflexion. The       hemorrhoids were small and Grade I (internal hemorrhoids that do not       prolapse).      The exam was otherwise without abnormality on direct and retroflexion       views. Impression:           - Preparation of the colon was fair.                       - Non-bleeding internal hemorrhoids.                       - The examination was otherwise normal on direct and                        retroflexion views.                       - No specimens collected. Recommendation:       - Discharge patient to home.                       - Resume previous diet.                       - Continue present medications.                       - Return to my office PRN. Procedure Code(s):    --- Professional ---                       [phone], Colonoscopy, flexible; diagnostic, including                        collection of specimen(s) by brushing or washing, when                        performed (separate procedure) Diagnosis Code(s):    --- Professional ---                       K64.0, First degree hemorrhoids                       K62.5, Hemorrhage of anus and rectum CPT copyright 2017 American Medical Association. All rights reserved. The codes documented in this report are preliminary and upon coder review may  be revised to meet current compliance requirements. Wyline Mood, MD Wyline Mood MD, MD 06/24/2017 11:25:42 AM  This report has been signed electronically. Number of Addenda: 0 Note Initiated On: 06/24/2017 10:54 AM Scope Withdrawal Time: 0 hours 9 minutes 46 seconds  Total Procedure Duration: 0 hours 16 minutes 12 seconds       [facility]

## 2017-06-27 ENCOUNTER — Encounter: Payer: Self-pay | Admitting: Gastroenterology

## 2017-06-27 NOTE — Anesthesia Postprocedure Evaluation (Signed)
Anesthesia Post Note  Patient: Dennis Gardner  Procedure(s) Performed: COLONOSCOPY WITH PROPOFOL (N/A )  Patient location during evaluation: PACU Anesthesia Type: General Level of consciousness: awake and alert Pain management: pain level controlled Vital Signs Assessment: post-procedure vital signs reviewed and stable Respiratory status: spontaneous breathing, nonlabored ventilation, respiratory function stable and patient connected to nasal cannula oxygen Cardiovascular status: blood pressure returned to baseline and stable Postop Assessment: no apparent nausea or vomiting Anesthetic complications: no     Last Vitals:  Vitals:   06/24/17 1139 06/24/17 1149  BP: 126/73 (!) 109/95  Pulse:    Resp:    Temp:    SpO2:      Last Pain:  Vitals:   06/24/17 1149  TempSrc:   PainSc: 0-No pain                 Yevette Edwards

## 2018-02-21 ENCOUNTER — Ambulatory Visit: Payer: BLUE CROSS/BLUE SHIELD | Admitting: Gastroenterology

## 2018-02-21 ENCOUNTER — Encounter: Payer: Self-pay | Admitting: Gastroenterology

## 2018-02-21 VITALS — BP 113/76 | HR 79 | Ht 74.0 in | Wt 276.2 lb

## 2018-02-21 DIAGNOSIS — R197 Diarrhea, unspecified: Secondary | ICD-10-CM

## 2018-02-21 NOTE — Progress Notes (Signed)
   Wyline MoodKiran Rehema Muffley MD, MRCP(U.K) [address]  [address]  [place], [place] [address]  Main: [phone]  Fax: [phone]   Primary Care Physician: Patient, No Pcp Per  Primary Gastroenterologist:  Dr. Wyline MoodKiran Tigran Haynie   No chief complaint on file.   HPI: Dennis Gardner is a 37 y.o. male   Summary of history :  Last seen in 05/2017 for GERD, blood in stool, change in bowel habits. I suspected IBS-D   Interval history   06/13/2017-  02/21/2018  Colonoscopy : 05/2017 : internal hemorrhoids .   Was doing well till early sthis week , his son had diarrhea, a few days later he started having gas pains, nausea, poor apetite , on Sunday , discomfort, has had a lot of gas, went to urgent care yesterday - started having diarrhea last night , still has diarrhea. No blood. No fever. No NSAID's. Ongoing for a week - not getting .    Current Outpatient Medications  Medication Sig Dispense Refill  . acetaminophen (TYLENOL) 325 MG tablet Take 650 mg by mouth every 6 (six) hours as needed.    Marland Kitchen. ibuprofen (ADVIL) 200 MG tablet Take by mouth.     No current facility-administered medications for this visit.     Allergies as of 02/21/2018  . (No Known Allergies)    ROS:  General: Negative for anorexia, weight loss, fever, chills, fatigue, weakness. ENT: Negative for hoarseness, difficulty swallowing , nasal congestion. CV: Negative for chest pain, angina, palpitations, dyspnea on exertion, peripheral edema.  Respiratory: Negative for dyspnea at rest, dyspnea on exertion, cough, sputum, wheezing.  GI: See history of present illness. GU:  Negative for dysuria, hematuria, urinary incontinence, urinary frequency, nocturnal urination.  Endo: Negative for unusual weight change.    Physical Examination:   There were no vitals taken for this visit.  General: Well-nourished, well-developed in no acute distress.  Eyes: No icterus. Conjunctivae pink. Mouth: Oropharyngeal mucosa moist and pink , no  lesions erythema or exudate. Lungs: Clear to auscultation bilaterally. Non-labored. Heart: Regular rate and rhythm, no murmurs rubs or gallops.  Abdomen: Bowel sounds are normal, nontender, nondistended, no hepatosplenomegaly or masses, no abdominal bruits or hernia , no rebound or guarding.   Extremities: No lower extremity edema. No clubbing or deformities. Neuro: Alert and oriented x 3.  Grossly intact. Skin: Warm and dry, no jaundice.   Psych: Alert and cooperative, normal mood and affect.   Imaging Studies: No results found.  Assessment and Plan:   Dennis Gardner is a 37 y.o. y/o male presents with 1 week history of non bloody diarrhea, son is also sick. Suspect infectious etiology, since not improving for a week will test his stool. If negative then likely a viral illness. Check celiac serology which he was supposed to have checked last time.     Dr Wyline MoodKiran Dvante Hands  MD,MRCP [facility](U.K) Follow up PRN

## 2018-02-23 LAB — CELIAC DISEASE AB SCREEN W/RFX
Antigliadin Abs, IgA: 4 units (ref 0–19)
IgA/Immunoglobulin A, Serum: 300 mg/dL (ref 90–386)
Transglutaminase IgA: <2 U/mL (ref 0–3)

## 2018-02-24 ENCOUNTER — Encounter: Payer: Self-pay | Admitting: Gastroenterology

## 2018-04-17 ENCOUNTER — Encounter: Payer: Self-pay | Admitting: Internal Medicine

## 2018-04-17 ENCOUNTER — Ambulatory Visit (INDEPENDENT_AMBULATORY_CARE_PROVIDER_SITE_OTHER): Payer: BLUE CROSS/BLUE SHIELD | Admitting: Internal Medicine

## 2018-04-17 VITALS — BP 130/80 | HR 80 | Ht 74.0 in | Wt 282.6 lb

## 2018-04-17 DIAGNOSIS — G4719 Other hypersomnia: Secondary | ICD-10-CM

## 2018-04-17 NOTE — Progress Notes (Signed)
[facility] [place] Pulmonary Medicine Consultation      Assessment and Plan:  Excessive daytime sleepiness. - Symptoms and signs of obstructive sleep apnea. - We will send for sleep study.  Insomnia. - May be related to sleep apnea, versus poor sleep hygiene after the birth of his son.  Currently takes occasional melatonin which is somewhat helpful. - We will reassess after evaluation for sleep apnea.  Orders Placed This Encounter  Procedures  . Home sleep test   Return in about 3 months (around 07/16/2018).   Date: 04/17/2018  MRN# [id number] Dennis Gardner [birth year]    Dennis Gardner is a 38 y.o. old male seen in consultation for chief complaint of:    Chief Complaint  Patient presents with  . Consult    self referral for snoring, inconsistent sleeping patterns and fatigue  . Snoring    last 4 years has increased  . Fatigue    last 4 years has increased    HPI:   He notes that he has been having trouble with fatigue, his wife says that he snores. His father has OSA. He has trouble staying asleep at night, he wakes spontaneously at night and has trouble falling back to sleep during the night. He occasionally wakes himself with a snores. He goes to be 10-1028 pm and more recently around 1130 to MN.  He puts his son to bed, watches tv until about 1130, falls asleep on couch, then goes to bedroom and falls asleep. He wakes up after about 1-2 hours and then wakes for 30 min several times.  He is started to get anxiety about sleep issues.  He is sleep during the day, especially when he is idle or for long drives. ESS 12.  He has tried melatonin in the past which has helped a bit.    Denies sleep paralysis, no sleep walking, no cataplexy. Denies jaw pain or TMJ, no dentures.    PMHX:   No past medical history on file. Surgical Hx:  Past Surgical History:  Procedure Laterality Date  . COLONOSCOPY WITH PROPOFOL N/A 06/24/2017   Procedure: COLONOSCOPY WITH PROPOFOL;  Surgeon: Wyline Mood, MD;  [facility]: [facility] ENDOSCOPY;  Service: Gastroenterology;  Laterality: N/A;  . parathyroid excision    . PARATHYROIDECTOMY     Family Hx:  No family history on file. Social Hx:   Social History   Tobacco Use  . Smoking status: Current Some Day Smoker  . Smokeless tobacco: Never Used  . Tobacco comment: very social, maybe 3 a month   Substance Use Topics  . Alcohol use: Yes    Frequency: Never  . Drug use: No   Medication:    Current Outpatient Medications:  .  acetaminophen (TYLENOL) 325 MG tablet, Take 650 mg by mouth every 6 (six) hours as needed., Disp: , Rfl:  .  ibuprofen (ADVIL) 200 MG tablet, Take by mouth., Disp: , Rfl:    Allergies:  Patient has no known allergies.  Review of Systems: Gen:  Denies  fever, sweats, chills HEENT: Denies blurred vision, double vision. bleeds, sore throat Cvc:  No dizziness, chest pain. Resp:   Denies cough or sputum production, shortness of breath Gi: Denies swallowing difficulty, stomach pain. Gu:  Denies bladder incontinence, burning urine Ext:   No Joint pain, stiffness. Skin: No skin rash,  hives  Endoc:  No polyuria, polydipsia. Psych: No depression, insomnia. Other:  All other systems were reviewed with the patient and were negative other that what is  mentioned in the HPI.   Physical Examination:   VS: BP 130/80 (BP Location: Left Arm, Cuff Size: Normal)   Pulse 80   Ht 6\' 2"  (1.88 m)   Wt 282 lb 9.6 oz (128.2 kg)   SpO2 96%   BMI 36.28 kg/m   General Appearance: No distress  Neuro:without focal findings,  speech normal,  HEENT: PERRLA, EOM intact.   Pulmonary: normal breath sounds, No wheezing.  CardiovascularNormal S1,S2.  No m/r/g.   Abdomen: Benign, Soft, non-tender. Renal:  No costovertebral tenderness  GU:  No performed at this time. Endoc: No evident thyromegaly, no signs of acromegaly. Skin:   warm, no rashes, no ecchymosis  Extremities: normal, no cyanosis, clubbing.  Other findings:     LABORATORY PANEL:   CBC No results for input(s): WBC, HGB, HCT, PLT in the last 168 hours. ------------------------------------------------------------------------------------------------------------------  Chemistries  No results for input(s): NA, K, CL, CO2, GLUCOSE, BUN, CREATININE, CALCIUM, MG, AST, ALT, ALKPHOS, BILITOT in the last 168 hours.  Invalid input(s): GFRCGP ------------------------------------------------------------------------------------------------------------------  Cardiac Enzymes No results for input(s): TROPONINI in the last 168 hours. ------------------------------------------------------------  RADIOLOGY:  No results found.     Thank  you for the consultation and for allowing [facility], Critical Care to assist in the care of your patient. Our recommendations are noted above.  Please contact us if we can be of further service.   Wells Guiles, M.D., F.C.C.P.  Board Certified in Internal Medicine, Pulmonary Medicine, Critical Care Medicine, and Sleep Medicine.  [place] [facility] Office Number: [phone]   04/17/2018

## 2018-04-17 NOTE — Patient Instructions (Signed)

## 2018-04-26 DIAGNOSIS — G4733 Obstructive sleep apnea (adult) (pediatric): Secondary | ICD-10-CM

## 2018-04-27 DIAGNOSIS — G4733 Obstructive sleep apnea (adult) (pediatric): Secondary | ICD-10-CM

## 2018-05-01 ENCOUNTER — Telehealth: Payer: Self-pay

## 2018-05-01 DIAGNOSIS — G4733 Obstructive sleep apnea (adult) (pediatric): Secondary | ICD-10-CM

## 2018-05-01 NOTE — Telephone Encounter (Signed)
LM on VM for patient to call for sleep study results.  Mild obstructive sleep apnea with AHI of 8.  Recommend auto-CPAP with pressure range of 5-20 cm H2O.

## 2018-05-02 NOTE — Telephone Encounter (Signed)
Pt is aware of results and voiced his understanding. Pt agreed to proceeding with cpap therapy. Order has been placed. Pt has been scheduled for ROV on 06/29/18 at 2:45. Nothing further is needed at this time.

## 2018-05-17 ENCOUNTER — Other Ambulatory Visit: Payer: Self-pay

## 2018-05-17 DIAGNOSIS — G4719 Other hypersomnia: Secondary | ICD-10-CM

## 2018-06-29 ENCOUNTER — Ambulatory Visit: Payer: BLUE CROSS/BLUE SHIELD | Admitting: Internal Medicine

## 2018-08-04 ENCOUNTER — Telehealth: Payer: Self-pay | Admitting: Internal Medicine

## 2018-08-04 NOTE — Progress Notes (Deleted)
  [facility] Pulmonary Medicine Consultation      Assessment and Plan:  Obstructive sleep apnea. - Mild OSA with AHI of 8, started on CPAP with pressure range 5-20. - We will send for sleep study.  Insomnia. - May be related to sleep apnea, versus poor sleep hygiene after the birth of his son.  Currently takes occasional melatonin which is somewhat helpful. - We will reassess after evaluation for sleep apnea.  No orders of the defined types were placed in this encounter.  No follow-ups on file.   Date: 08/04/2018  MRN# [id number] Dennis Gardner [birth year]    Dennis Gardner is a 38 y.o. old male seen in consultation for chief complaint of:    No chief complaint on file.   HPI:   He notes that he has been having trouble with fatigue, his wife says that he snores. His father has OSA. He has trouble staying asleep at night, he wakes spontaneously at night and has trouble falling back to sleep during the night. He occasionally wakes himself with a snores. He goes to be 10-1028 pm and more recently around 1130 to MN.  He puts his son to bed, watches tv until about 1130, falls asleep on couch, then goes to bedroom and falls asleep. He wakes up after about 1-2 hours and then wakes for 30 min several times.  He is started to get anxiety about sleep issues.  He is sleep during the day, especially when he is idle or for long drives. ESS 12.  He has tried melatonin in the past which has helped a bit.    Denies sleep paralysis, no sleep walking, no cataplexy. Denies jaw pain or TMJ, no dentures.   **HST 04/25/2018>> mild OSA with AHI of 8.  Started on CPAP with pressure range 5-20.     LABORATORY PANEL:   CBC No results for input(s): WBC, HGB, HCT, PLT in the last 168 hours. ------------------------------------------------------------------------------------------------------------------  Chemistries  No results for input(s): NA, K, CL, CO2, GLUCOSE, BUN, CREATININE, CALCIUM, MG, AST, ALT,  ALKPHOS, BILITOT in the last 168 hours.  Invalid input(s): GFRCGP ------------------------------------------------------------------------------------------------------------------  Cardiac Enzymes No results for input(s): TROPONINI in the last 168 hours. ------------------------------------------------------------  RADIOLOGY:  No results found.     Thank  you for the consultation and for allowing [facility], Critical Care to assist in the care of your patient. Our recommendations are noted above.  Please contact us if we can be of further service.   Wells Guiles, M.D., F.C.C.P.  Board Certified in Internal Medicine, Pulmonary Medicine, Critical Care Medicine, and Sleep Medicine.  [place] [facility] Office Number: [phone]   08/04/2018

## 2018-08-04 NOTE — Telephone Encounter (Signed)
Called pt to confirm 08/07/2018 appt. Office, phone, virtual visit?

## 2018-08-04 NOTE — Telephone Encounter (Signed)
I have spoke to ebony with [employer] and requested that pt be enrolled in [facility]. Karel Jarvis stated that she is unable to locate serial number to pt's cpap to enroll him in [facility] [place], however she does see that pt was setup on 07/18/2018. Karel Jarvis stated that she would contact pt and attempt to obtain serial number.  Karel Jarvis will contact our office with a update.

## 2018-08-07 ENCOUNTER — Ambulatory Visit: Payer: BLUE CROSS/BLUE SHIELD | Admitting: Internal Medicine

## 2018-08-07 NOTE — Telephone Encounter (Signed)
LM to confirm appt anf request that pt bring SD card in back of cpap machine.

## 2018-08-08 NOTE — Telephone Encounter (Signed)
Pt no showed 08/07/2018 office visit.

## 2019-03-29 IMAGING — CT CT ABD-PELV W/ CM
2 of 4 series · 16 of 46 positions shown, 18 images · IV contrast (APPLIED)
Comparison: None.

CLINICAL DATA: 36-year-old with nausea, vomiting and epigastric
pain.

EXAM:
CT ABDOMEN AND PELVIS WITH CONTRAST
TECHNIQUE: Multidetector CT imaging of the abdomen and pelvis was performed
using the standard protocol following bolus administration of
intravenous contrast.
CONTRAST:  125mL JWJX8V-5FF IOPAMIDOL (JWJX8V-5FF) INJECTION 61%

[Series 2: routine abd/pel with · axial · 0.80mm/px · z∈[-762,-247]mm · 13 of 113 slices shown, 15 images]
[im 5/113  soft-tissue]
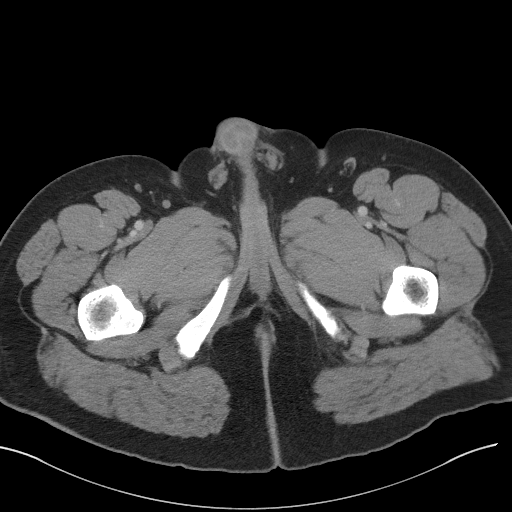
[im 5/113  bone]
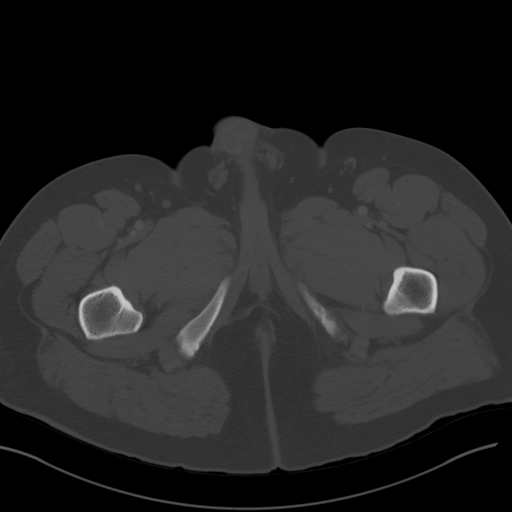
[im 14/113  soft-tissue]
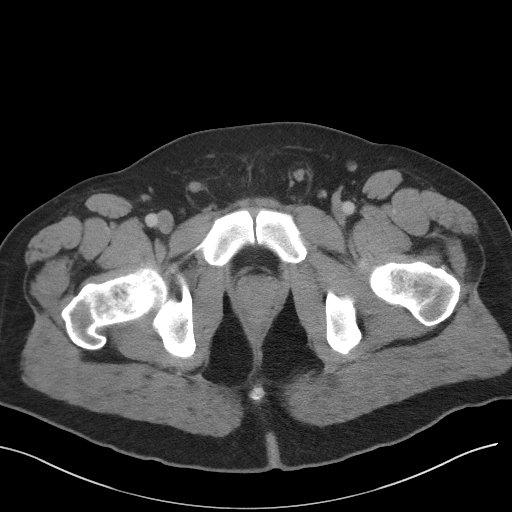
[im 23/113  soft-tissue]
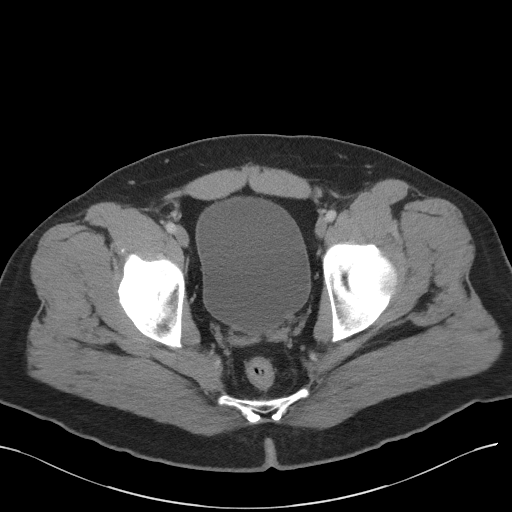
[im 32/113  soft-tissue]
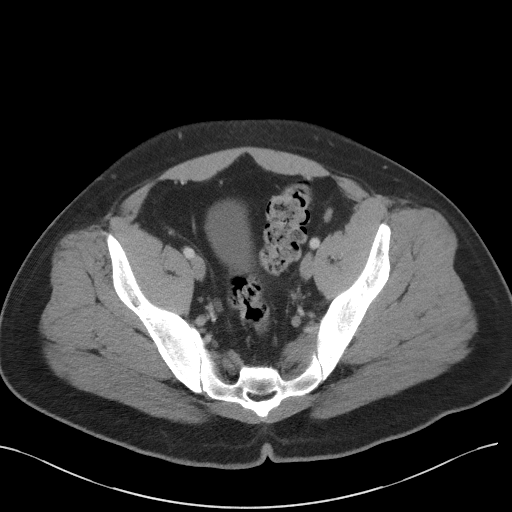
[im 41/113  soft-tissue]
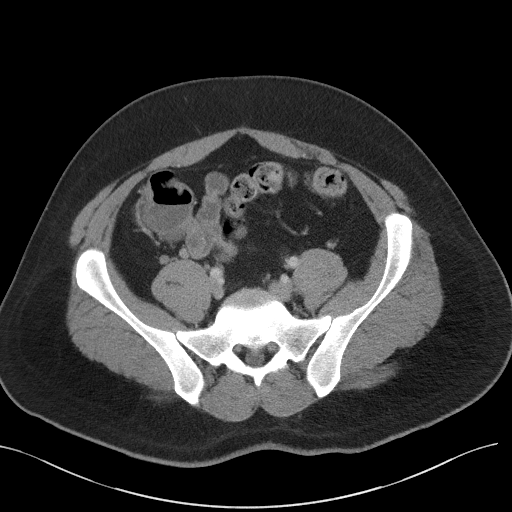
[im 50/113  soft-tissue]
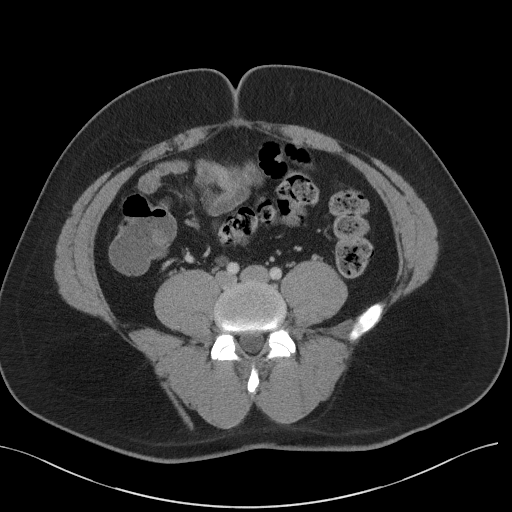
[im 59/113  soft-tissue]
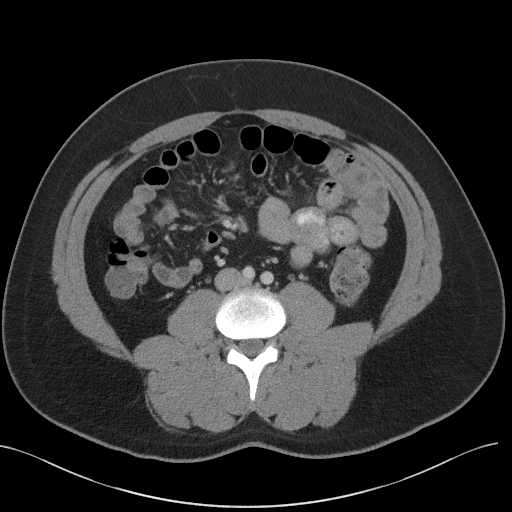
[im 63/113  soft-tissue]
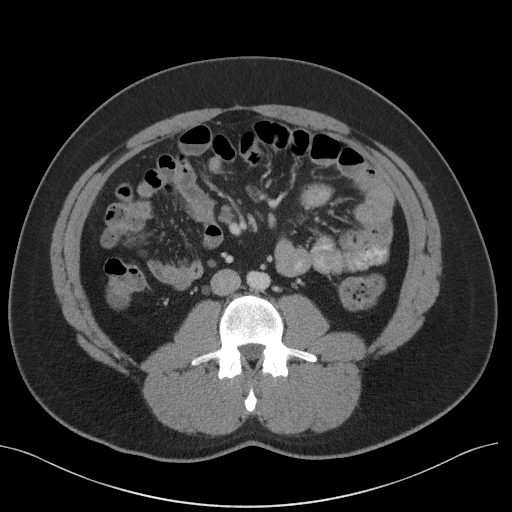
[im 72/113  soft-tissue]
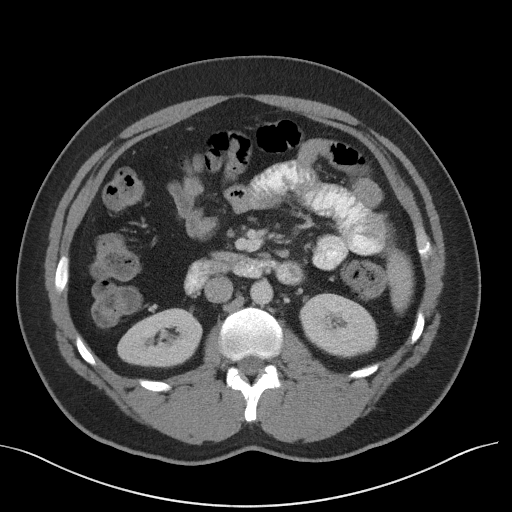
[im 72/113  bone]
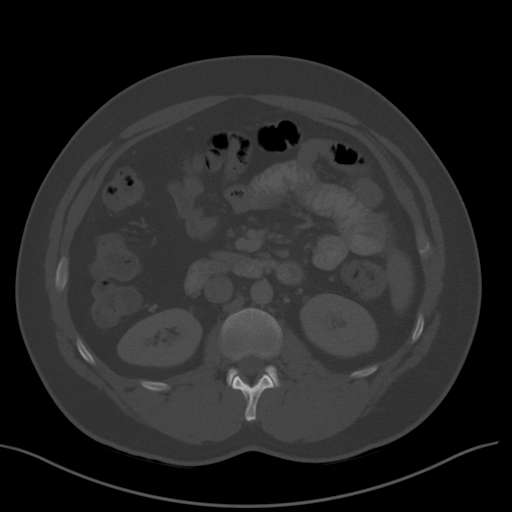
[im 81/113  soft-tissue]
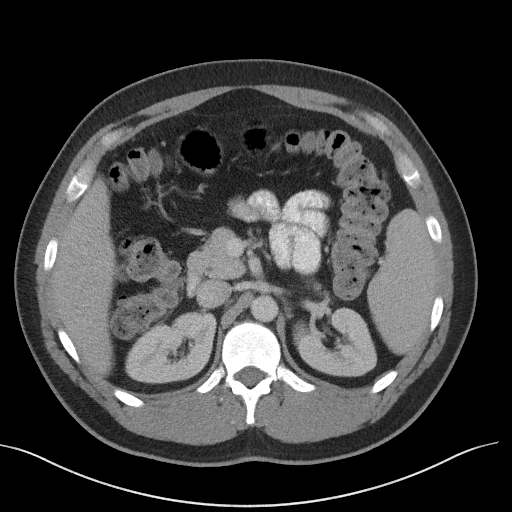
[im 90/113  soft-tissue]
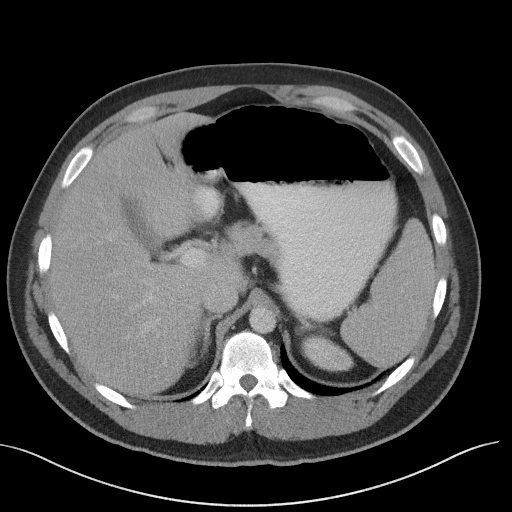
[im 99/113  soft-tissue]
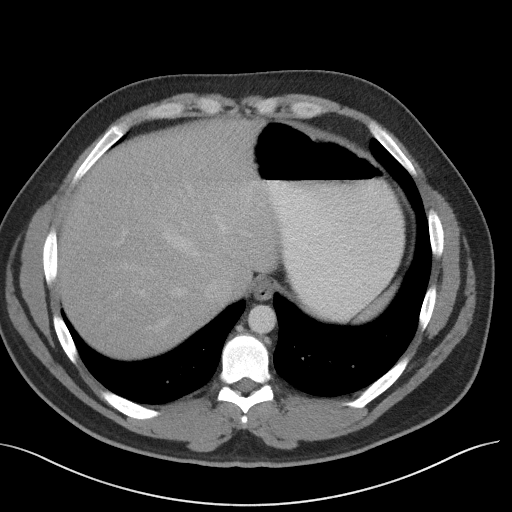
[im 108/113  soft-tissue]
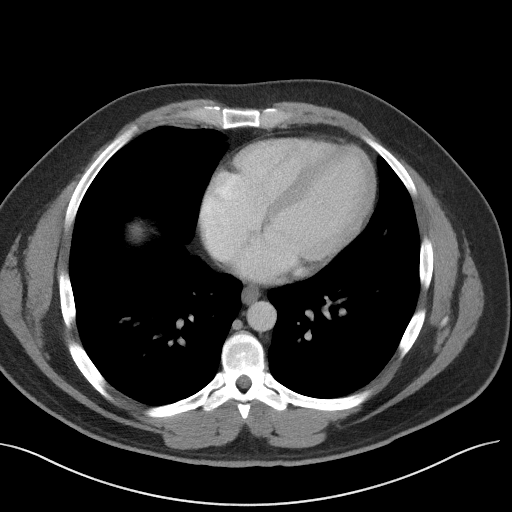

[Series 5: coronal st · coronal · 0.84mm/px · 3 of 112 slices shown]
[im 38/112  soft-tissue]
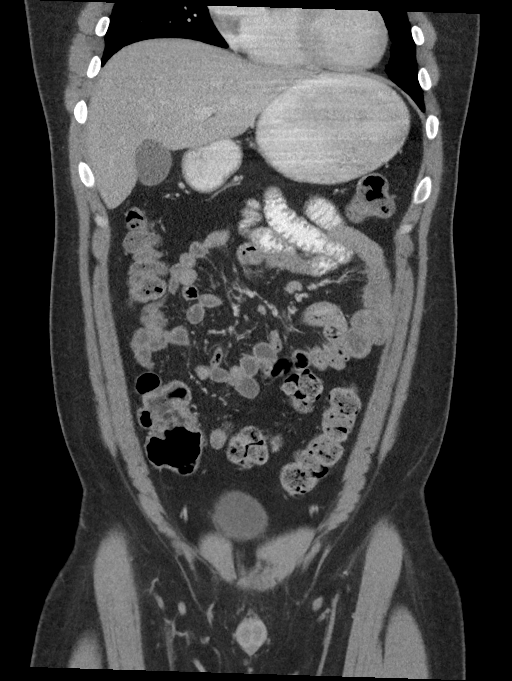
[im 50/112  soft-tissue]
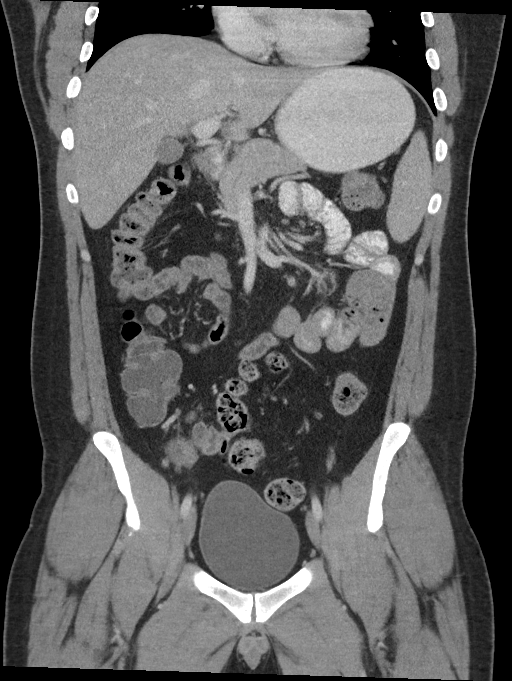
[im 62/112  soft-tissue]
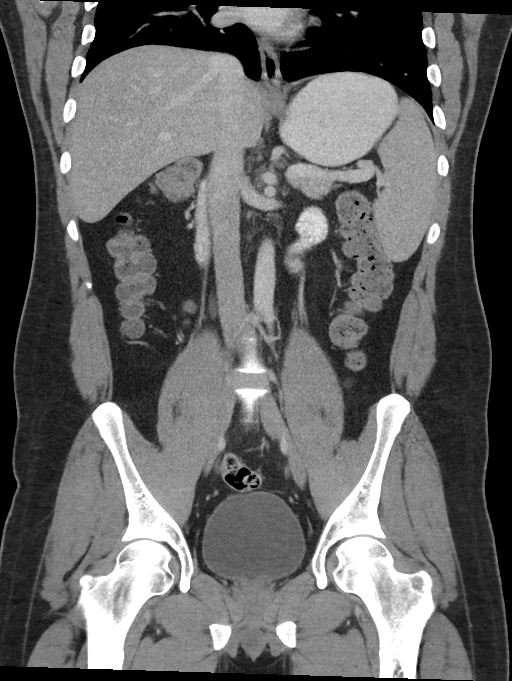

[16 of 46 positions shown; findings below may reference images not displayed]

FINDINGS: Lower chest: Lung bases are clear.  No pleural effusions.

Hepatobiliary: Normal appearance of the liver, gallbladder and
portal venous system.

Pancreas: Normal appearance of the pancreas without inflammation or
duct dilatation.

Spleen: Normal appearance of spleen without enlargement.

Adrenals/Urinary Tract: Normal adrenal glands. Mild dilatation of
the right ureter. Mild dilatation of the right renal pelvis which
could be related to an extrarenal pelvis. Subtle density near the
right ureterovesical junction but a stone is not confidently
identified. Urinary bladder is mildly distended. Normal appearance
of the left ureter and no evidence for left hydronephrosis. No
suspicious renal lesions. No perinephric edema or stranding.

Stomach/Bowel: Stomach is mildly distended. Normal appearance of the
small bowel. Appendix is normal. No acute abnormality to the colon.
No evidence for bowel inflammation or obstruction.

Vascular/Lymphatic: No significant vascular findings are present. No
enlarged abdominal or pelvic lymph nodes.

Reproductive: Prostate is unremarkable.

Other: No ascites.  No free air.

Musculoskeletal: No acute bone abnormality.
IMPRESSION: Mild dilatation of the right ureter and right renal pelvis of
uncertain etiology. Subtle density near the right ureterovesical
junction but no definite stone.

Normal appendix.  No acute bowel abnormality.

## 2019-03-29 IMAGING — CR DG ABDOMEN 2V
3 series · 3 of 3 positions shown · non-contrast
Comparison: None.

CLINICAL DATA: Abdominal pain

EXAM:
ABDOMEN - 2 VIEW

[abdomen erect]
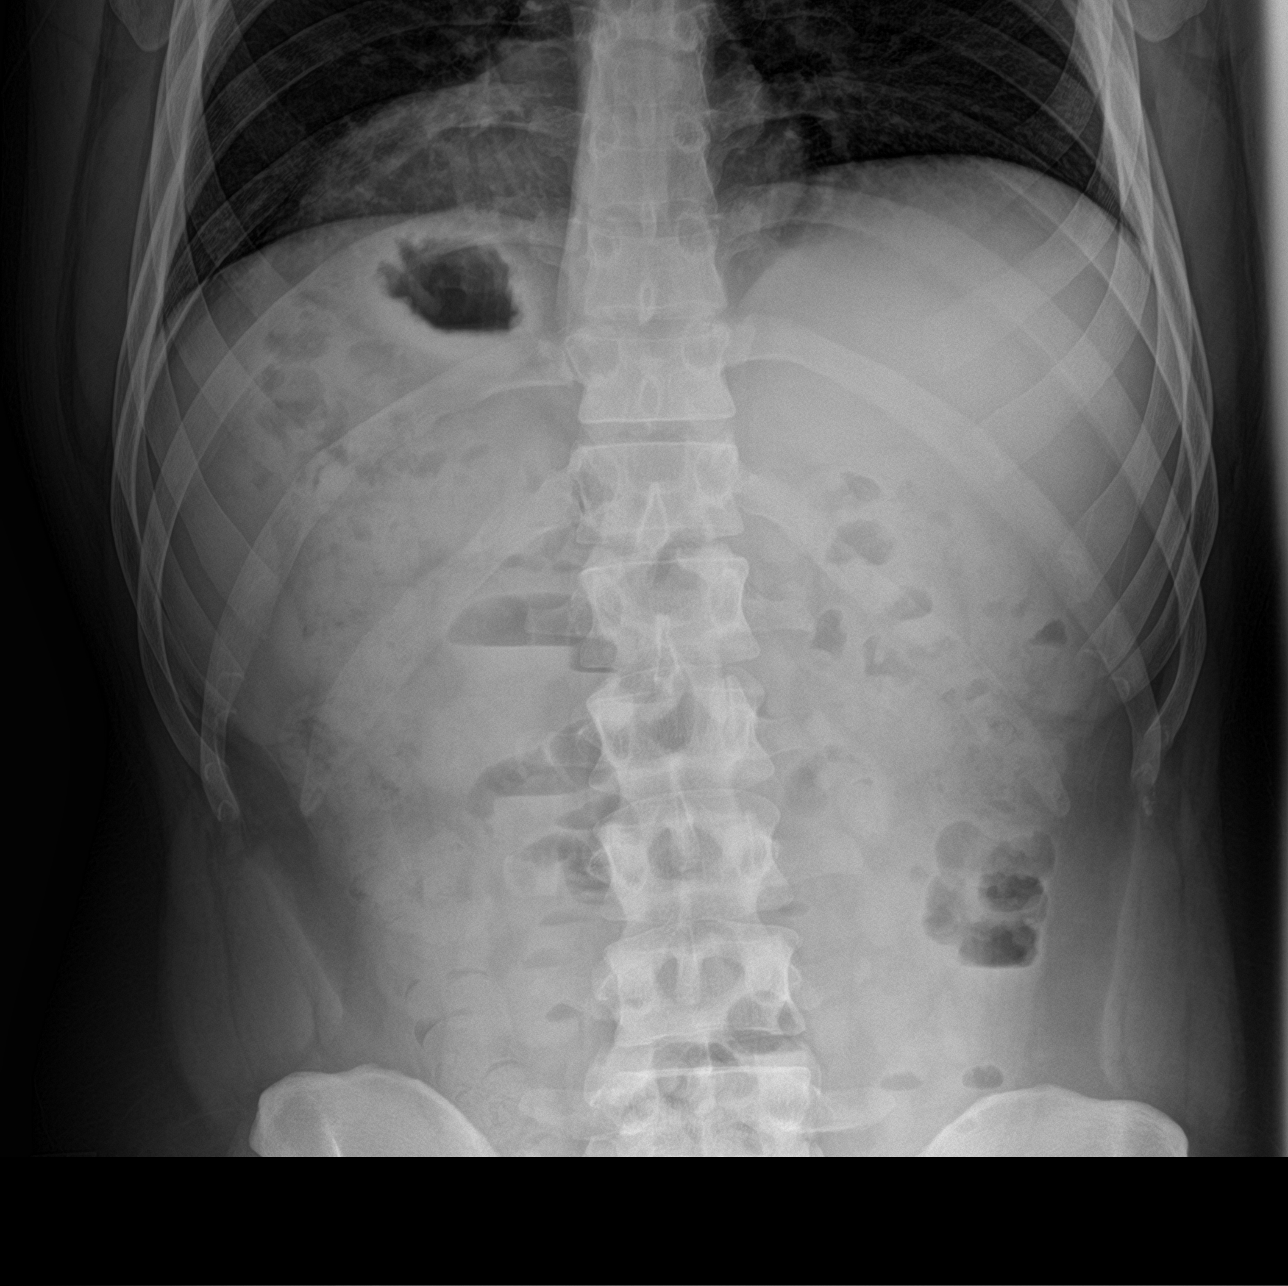

[abdomen supine (1 of 2)]
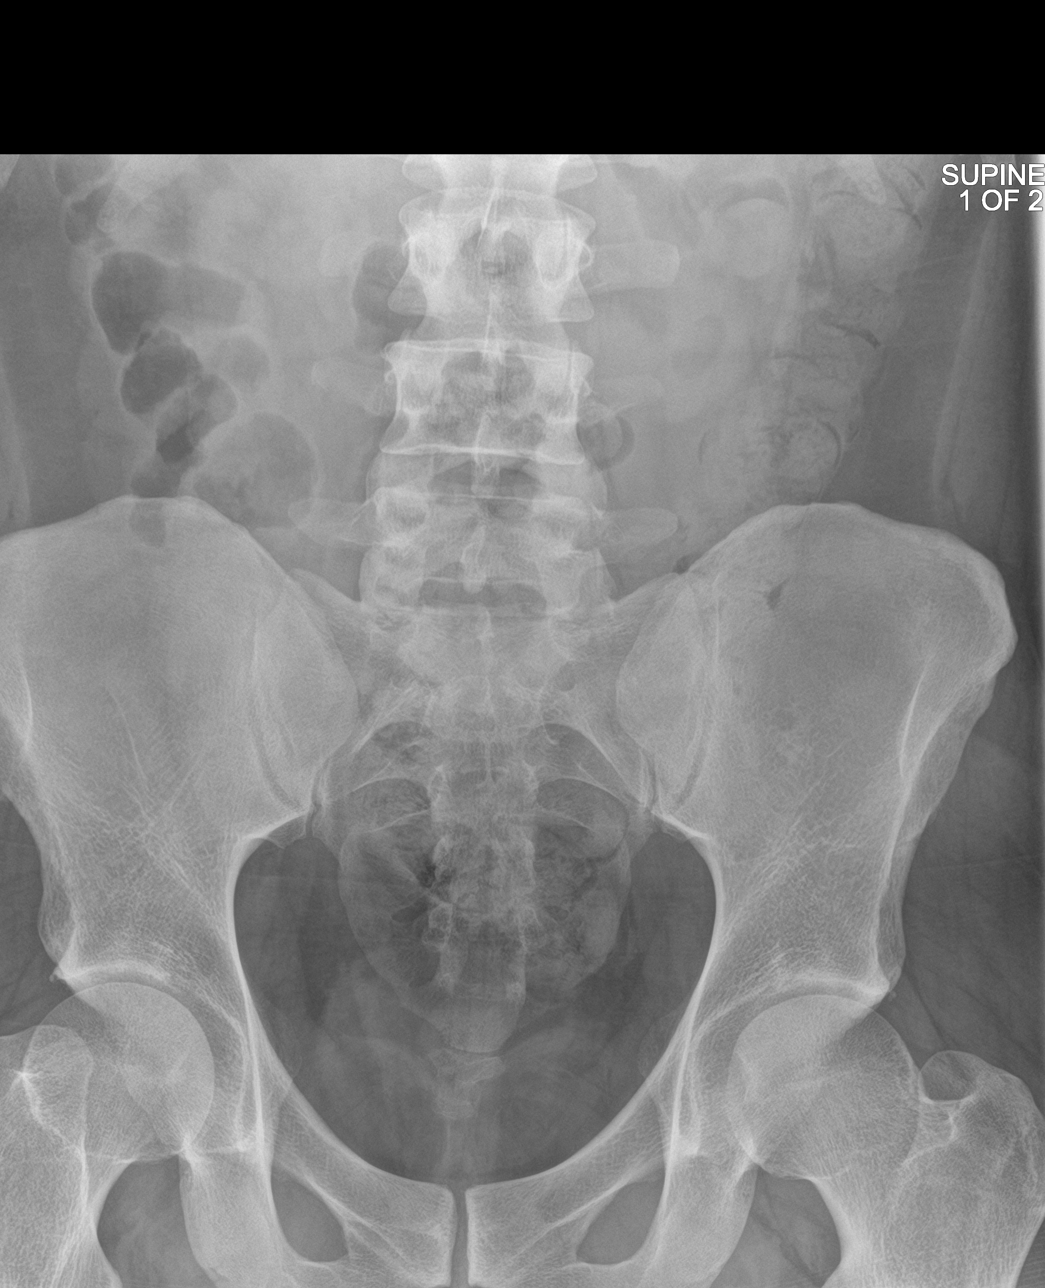

[abdomen supine (2 of 2)]
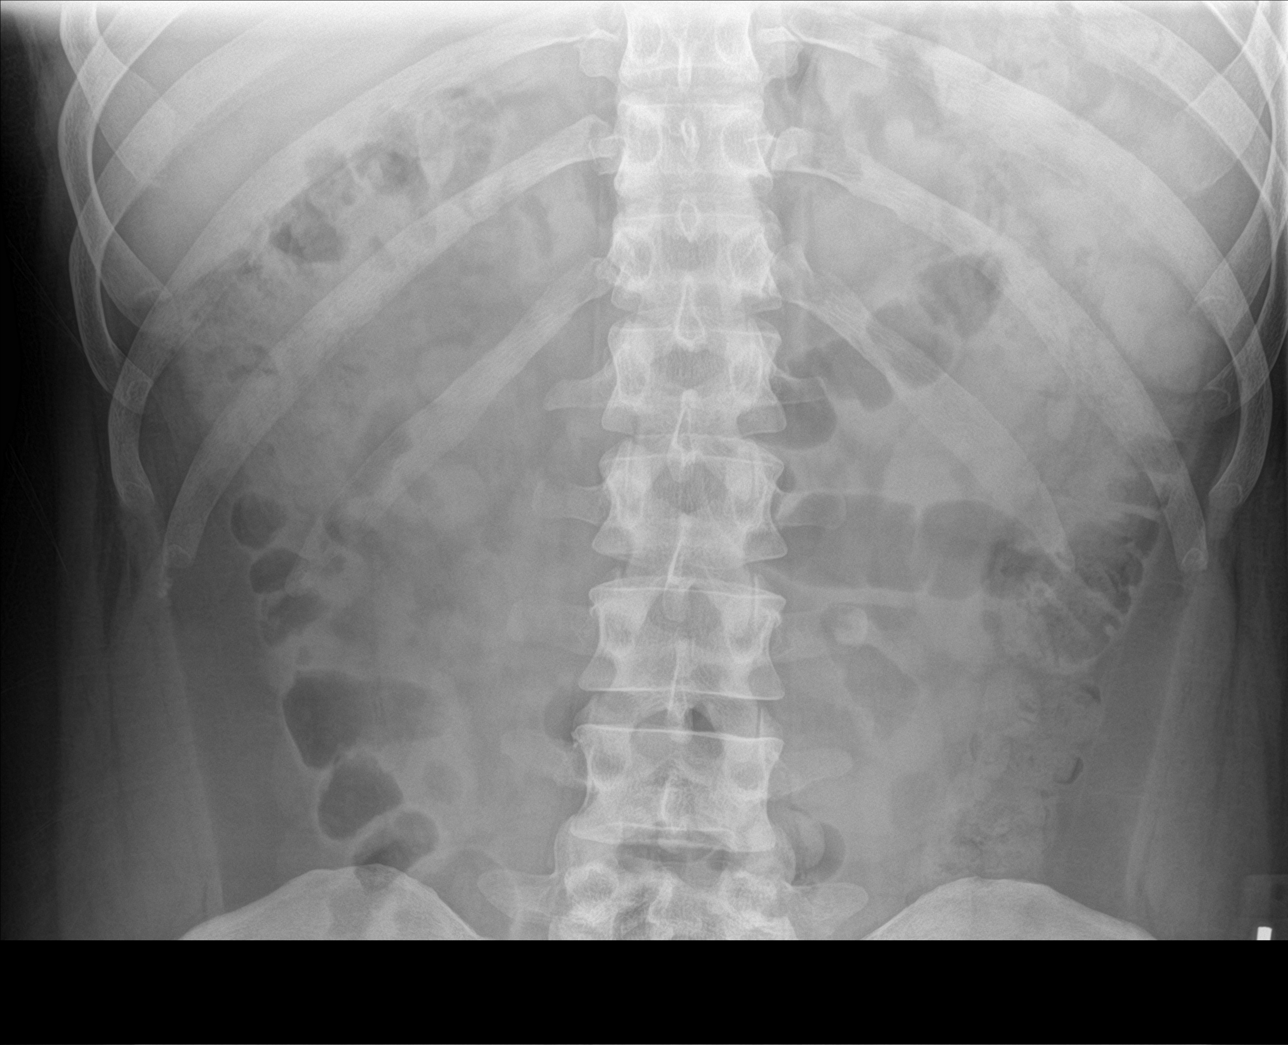

[3 of 3 positions shown; findings below may reference images not displayed]

FINDINGS: A few mildly prominent central small bowel loops. Gas and stool
noted throughout the colon. No free air organomegaly. No suspicious
calcification.
IMPRESSION: Nonspecific bowel gas pattern with a few mildly prominent central
small bowel loops. This could be related to focal ileus or early
small bowel obstruction.

## 2019-09-26 DIAGNOSIS — M25511 Pain in right shoulder: Secondary | ICD-10-CM | POA: Diagnosis not present

## 2019-11-02 DIAGNOSIS — M25511 Pain in right shoulder: Secondary | ICD-10-CM | POA: Diagnosis not present

## 2019-11-07 ENCOUNTER — Other Ambulatory Visit: Payer: Self-pay | Admitting: Physician Assistant

## 2019-11-07 DIAGNOSIS — M25511 Pain in right shoulder: Secondary | ICD-10-CM

## 2019-11-27 ENCOUNTER — Ambulatory Visit: Payer: BC Managed Care – PPO | Attending: Physician Assistant

## 2019-11-27 ENCOUNTER — Ambulatory Visit: Admission: RE | Admit: 2019-11-27 | Payer: BC Managed Care – PPO | Source: Ambulatory Visit

## 2020-02-21 DIAGNOSIS — U071 COVID-19: Secondary | ICD-10-CM | POA: Diagnosis not present

## 2020-02-21 DIAGNOSIS — Z03818 Encounter for observation for suspected exposure to other biological agents ruled out: Secondary | ICD-10-CM | POA: Diagnosis not present

## 2020-02-21 DIAGNOSIS — Z20822 Contact with and (suspected) exposure to covid-19: Secondary | ICD-10-CM | POA: Diagnosis not present

## 2020-06-19 DIAGNOSIS — Z20822 Contact with and (suspected) exposure to covid-19: Secondary | ICD-10-CM | POA: Diagnosis not present

## 2020-06-19 DIAGNOSIS — Z03818 Encounter for observation for suspected exposure to other biological agents ruled out: Secondary | ICD-10-CM | POA: Diagnosis not present

## 2020-06-20 ENCOUNTER — Other Ambulatory Visit: Payer: Self-pay

## 2020-06-20 ENCOUNTER — Ambulatory Visit
Admission: EM | Admit: 2020-06-20 | Discharge: 2020-06-20 | Disposition: A | Discharge status: Home / Self Care | Payer: BC Managed Care – PPO

## 2020-06-20 ENCOUNTER — Ambulatory Visit (INDEPENDENT_AMBULATORY_CARE_PROVIDER_SITE_OTHER): Payer: BC Managed Care – PPO

## 2020-06-20 DIAGNOSIS — R059 Cough, unspecified: Secondary | ICD-10-CM

## 2020-06-20 DIAGNOSIS — R0602 Shortness of breath: Secondary | ICD-10-CM

## 2020-06-20 DIAGNOSIS — J069 Acute upper respiratory infection, unspecified: Secondary | ICD-10-CM | POA: Diagnosis not present

## 2020-06-20 NOTE — Discharge Instructions (Addendum)
Your chest x-ray is normal.  Take the benzonatate as directed for your cough.  Follow up with your primary care provider if your symptoms are not improving.

## 2020-06-20 NOTE — ED Triage Notes (Signed)
Patient presents to Urgent Care with complaints of productive cough x 4 days. Pt here for a CXR. Pt states he had a virtual provider yesterday. He was prescribed a cough med but here for further eval. He has had 3 negative covid tests in the past 3 days.Treating discomfort with Aleve.    Denies fever, abdominal pain, n/v, diarrhea.

## 2020-06-20 NOTE — ED Provider Notes (Signed)
Dennis Gardner    CSN: [id number] Arrival date & time: 06/20/20  1014      History   Chief Complaint Chief Complaint  Patient presents with  . Cough    X 4 days     HPI Dennis Gardner is a 40 y.o. male.   Patient presents with productive cough x4 days.  He also reports mild shortness of breath when walking up steps this morning.  He denies fever, chills,, vomiting, diarrhea, or other symptoms.  He states he has had 3 negative COVID tests since the onset of his symptoms.  He had an E-visit yesterday and was prescribed [employer] which he has not taken.  Treatment attempted at home with Aleve.  He denies pertinent medical history.  The history is provided by the patient.    History reviewed. No pertinent past medical history.  There are no problems to display for this patient.   Past Surgical History:  Procedure Laterality Date  . COLONOSCOPY WITH PROPOFOL N/A 06/24/2017   Procedure: COLONOSCOPY WITH PROPOFOL;  Surgeon: Wyline Mood, MD;  Location: [facility] ENDOSCOPY;  Service: Gastroenterology;  Laterality: N/A;  . parathyroid excision    . PARATHYROIDECTOMY         Home Medications    Prior to Admission medications   Medication Sig Start Date End Date Taking? Authorizing Provider  acetaminophen (TYLENOL) 325 MG tablet Take 650 mg by mouth every 6 (six) hours as needed.    [provider]  benzonatate (TESSALON) 200 MG capsule Take 200 mg by mouth 3 (three) times daily as needed. 06/20/20   [provider]  ibuprofen (ADVIL) 200 MG tablet Take by mouth.    [provider]    Family History Family History  Problem Relation Age of Onset  . Hypertension Mother   . Diabetes Father     Social History Social History   Tobacco Use  . Smoking status: Current Some Day Smoker  . Smokeless tobacco: Never Used  . Tobacco comment: very social, maybe 3 a month   Vaping Use  . Vaping Use: Never used  Substance Use Topics  . Alcohol use: Yes   . Drug use: No     Allergies   Patient has no known allergies.   Review of Systems Review of Systems  Constitutional: Negative for chills and fever.  HENT: Negative for congestion, ear pain and sore throat.   Eyes: Negative for pain and visual disturbance.  Respiratory: Positive for cough and shortness of breath.   Cardiovascular: Negative for chest pain and palpitations.  Gastrointestinal: Negative for abdominal pain, diarrhea and vomiting.  Genitourinary: Negative for dysuria and hematuria.  Musculoskeletal: Negative for arthralgias and back pain.  Skin: Negative for color change and rash.  Neurological: Negative for seizures and syncope.  All other systems reviewed and are negative.    Physical Exam Triage Vital Signs ED Triage Vitals  Enc Vitals Group     BP      Pulse      Resp      Temp      Temp src      SpO2      Weight      Height      Head Circumference      Peak Flow      Pain Score      Pain Loc      Pain Edu?      Excl. in GC?    No data found.  Updated Vital Signs BP 120/81 (BP Location: Right Arm)   Pulse 88   Temp 97.7 F (36.5 C) (Temporal)   Resp 16   SpO2 96%   Visual Acuity Right Eye Distance:   Left Eye Distance:   Bilateral Distance:    Right Eye Near:   Left Eye Near:    Bilateral Near:     Physical Exam Vitals and nursing note reviewed.  Constitutional:      General: He is not in acute distress.    Appearance: He is well-developed. He is obese. He is not ill-appearing.  HENT:     Head: Normocephalic and atraumatic.     Right Ear: Tympanic membrane normal.     Left Ear: Tympanic membrane normal.     Nose: Nose normal.     Mouth/Throat:     Mouth: Mucous membranes are moist.     Pharynx: Oropharynx is clear.  Eyes:     Conjunctiva/sclera: Conjunctivae normal.  Cardiovascular:     Rate and Rhythm: Normal rate and regular rhythm.     Heart sounds: Normal heart sounds.  Pulmonary:     Effort: Pulmonary effort is  normal. No respiratory distress.     Breath sounds: Normal breath sounds.  Abdominal:     Palpations: Abdomen is soft.     Tenderness: There is no abdominal tenderness.  Musculoskeletal:     Cervical back: Neck supple.  Skin:    General: Skin is warm and dry.  Neurological:     General: No focal deficit present.     Mental Status: He is alert and oriented to person, place, and time.     Gait: Gait normal.  Psychiatric:        Mood and Affect: Mood normal.        Behavior: Behavior normal.      UC Treatments / Results  Labs (all labs ordered are listed, but only abnormal results are displayed) Labs Reviewed - No data to display  EKG   Radiology DG Chest 2 View  Result Date: 06/20/2020 CLINICAL DATA:  Corrective cough short of breath EXAM: CHEST - 2 VIEW COMPARISON:  None. FINDINGS: The heart size and mediastinal contours are within normal limits. Both lungs are clear. The visualized skeletal structures are unremarkable. IMPRESSION: No active cardiopulmonary disease. Electronically Signed   By: Marlan Palau M.D.   On: 06/20/2020 11:12    Procedures Procedures (including critical care time)  Medications Ordered in UC Medications - No data to display  Initial Impression / Assessment and Plan / UC Course  I have reviewed the triage vital signs and the nursing notes.  Pertinent labs & imaging results that were available during my care of the patient were reviewed by me and considered in my medical decision making (see chart for details).   Viral URI, cough, shortness of breath.  Patient is well-appearing and his exam is reassuring.  Lung sounds clear, O2 sat 96%.  Chest x-ray normal.  Patient declines COVID test today.  Instructed patient to take the benzonatate as needed for cough which he was prescribed from an E-visit last evening.  Instructed him to follow-up with his PCP if his symptoms or not improving.  He agrees to plan of care.      Final Clinical Impressions(s)  / UC Diagnoses   Final diagnoses:  Viral URI  Cough  SOB (shortness of breath)     Discharge Instructions     Your chest x-ray is normal.  Take  the benzonatate as directed for your cough.  Follow up with your primary care provider if your symptoms are not improving.        ED Prescriptions    None     PDMP not reviewed this encounter.   Mickie Bail, NP 06/20/20 1121

## 2021-06-15 DIAGNOSIS — M545 Low back pain, unspecified: Secondary | ICD-10-CM | POA: Diagnosis not present

## 2021-06-15 DIAGNOSIS — M5416 Radiculopathy, lumbar region: Secondary | ICD-10-CM | POA: Diagnosis not present

## 2021-06-15 DIAGNOSIS — S39012A Strain of muscle, fascia and tendon of lower back, initial encounter: Secondary | ICD-10-CM | POA: Diagnosis not present

## 2021-08-06 ENCOUNTER — Ambulatory Visit: Payer: BC Managed Care – PPO | Admitting: Physician Assistant

## 2021-08-06 ENCOUNTER — Encounter: Payer: Self-pay | Admitting: Physician Assistant

## 2021-08-06 VITALS — BP 137/78 | HR 94 | Temp 98.7°F | Wt 298.0 lb

## 2021-08-06 DIAGNOSIS — R635 Abnormal weight gain: Secondary | ICD-10-CM | POA: Diagnosis not present

## 2021-08-06 DIAGNOSIS — R400 Somnolence: Secondary | ICD-10-CM

## 2021-08-06 DIAGNOSIS — R030 Elevated blood-pressure reading, without diagnosis of hypertension: Secondary | ICD-10-CM | POA: Insufficient documentation

## 2021-08-06 DIAGNOSIS — I83891 Varicose veins of right lower extremities with other complications: Secondary | ICD-10-CM | POA: Diagnosis not present

## 2021-08-06 DIAGNOSIS — G4700 Insomnia, unspecified: Secondary | ICD-10-CM

## 2021-08-06 DIAGNOSIS — Z114 Encounter for screening for human immunodeficiency virus [HIV]: Secondary | ICD-10-CM

## 2021-08-06 DIAGNOSIS — R0681 Apnea, not elsewhere classified: Secondary | ICD-10-CM

## 2021-08-06 DIAGNOSIS — Z1159 Encounter for screening for other viral diseases: Secondary | ICD-10-CM

## 2021-08-06 MED ORDER — TRAZODONE HCL 50 MG PO TABS: 25.0000 mg | ORAL_TABLET | Freq: Every evening | ORAL | 3 refills | Status: DC | PRN

## 2021-08-06 NOTE — Assessment & Plan Note (Signed)
Discussed sleep hygiene, will improve with sleep apnea treatment. Can try prn trazodone, 25-50 mg at bedtime.

## 2021-08-06 NOTE — Assessment & Plan Note (Signed)
High suspicion for sleep apnea Referred for an infacility sleep study w/ autotitration of cpap if indicated.

## 2021-08-06 NOTE — Progress Notes (Signed)
New Patient Office Visit  Subjective    Patient ID: Dennis Gardner, male    DOB: 1981/02/21  Age: 41 y.o. MRN: [id number]  CC: new pt establish care w/ several concerns   HPI Dennis Gardner presents to establish care.  He reports he has gained weight and thinks this is contributing to him having sleep apnea.  He has not been diagnosed but states his wife has witnessed episodes.  He would like to see about getting a sleep study.    Reports witnessed apneic episodes, frequently wakes himself up at night. Fatigue during the day. States he had a home sleep study in 2020 but it was not accurate as he took it off.  Reports associated insomnia, tried Melatonin without success. Has a difficult time falling asleep due to not being able to turn his brain off. Not all the time, but when it does happen will stay up 4-5 hours.   Reports right leg varicose veins that causes swelling and pain. Lower leg and behind right knee.   Notable PMH is s/p parathyroidectomy for high calcium levels in 2004.  Outpatient Encounter Medications as of 08/06/2021  Medication Sig   traZODone (DESYREL) 50 MG tablet Take 0.5-1 tablets (25-50 mg total) by mouth at bedtime as needed for sleep.   [DISCONTINUED] acetaminophen (TYLENOL) 325 MG tablet Take 650 mg by mouth every 6 (six) hours as needed.   [DISCONTINUED] benzonatate (TESSALON) 200 MG capsule Take 200 mg by mouth 3 (three) times daily as needed.   [DISCONTINUED] ibuprofen (ADVIL) 200 MG tablet Take by mouth.   No facility-administered encounter medications on file as of 08/06/2021.    Past Medical History:  Diagnosis Date   Asthma    Thyroid disease     Past Surgical History:  Procedure Laterality Date   COLONOSCOPY WITH PROPOFOL N/A 06/24/2017   Procedure: COLONOSCOPY WITH PROPOFOL;  Surgeon: Wyline Mood, MD;  Location: [facility] ENDOSCOPY;  Service: Gastroenterology;  Laterality: N/A;   parathyroid excision     PARATHYROIDECTOMY      Family History  Problem  Relation Age of Onset   Hypertension Mother    Diabetes Father     Social History   Socioeconomic History   Marital status: Married    Spouse name: Not on file   Number of children: Not on file   Years of education: Not on file   Highest education level: Not on file  Occupational History   Not on file  Tobacco Use   Smoking status: Some Days   Smokeless tobacco: Never   Tobacco comments:    very social, maybe 3 a month   Vaping Use   Vaping Use: Never used  Substance and Sexual Activity   Alcohol use: Yes   Drug use: No   Sexual activity: Yes  Other Topics Concern   Not on file  Social History Narrative   Not on file   Social Determinants of Health   Financial Resource Strain: Not on file  Food Insecurity: Not on file  Transportation Needs: Not on file  Physical Activity: Not on file  Stress: Not on file  Social Connections: Not on file  Intimate Partner Violence: Not on file    Review of Systems  Constitutional:  Positive for malaise/fatigue.       Appetite change, irritability  Respiratory:  Positive for shortness of breath.   Psychiatric/Behavioral:  The patient has insomnia.         Objective    BP 137/78 (  BP Location: Left Arm, Cuff Size: Large) Comment (BP Location): left arm  Pulse 94   Temp 98.7 F (37.1 C) (Oral)   Wt 298 lb (135.2 kg)   SpO2 99%   BMI 38.26 kg/m   Physical Exam Constitutional:      General: He is awake.     Appearance: He is well-developed.  HENT:     Head: Normocephalic.  Eyes:     Conjunctiva/sclera: Conjunctivae normal.  Cardiovascular:     Rate and Rhythm: Normal rate and regular rhythm.     Heart sounds: Normal heart sounds.  Pulmonary:     Effort: Pulmonary effort is normal.     Breath sounds: Normal breath sounds.  Musculoskeletal:     Right lower leg: Edema present.     Left lower leg: No edema.  Skin:    General: Skin is warm.     Comments: Clusters of varicose veins right lower extremity with the  largest behind right knee.  Some associated edema to right lower leg  Neurological:     Mental Status: He is alert and oriented to person, place, and time.  Psychiatric:        Attention and Perception: Attention normal.        Mood and Affect: Mood normal.        Speech: Speech normal.        Behavior: Behavior is cooperative.    Assessment & Plan:   Problem List Items Addressed This Visit       Cardiovascular and Mediastinum   Varicose veins of right leg with edema    Recommended compression socks. Will refer to vascular for treatment per pt      Relevant Orders   Ambulatory referral to Vascular Surgery     Other   Apneic episode - Primary    High suspicion for sleep apnea Referred for an infacility sleep study w/ autotitration of cpap if indicated.       Relevant Orders   Ambulatory referral to Sleep Studies   Weight gain    Discussed diet and exercise at length. Pt aware he needs to make changes. Likely contributing to possible sleep apnea. Will check labs      Relevant Orders   HgB A1c   Lipid Profile   TSH + free T4   Insomnia    Discussed sleep hygiene, will improve with sleep apnea treatment. Can try prn trazodone, 25-50 mg at bedtime.         Relevant Medications   traZODone (DESYREL) 50 MG tablet   Elevated blood pressure reading in office without diagnosis of hypertension    initially elevated but then improved Will monitor in 6 mo       Other Visit Diagnoses     Encounter for screening for HIV       Relevant Orders   HIV antibody (with reflex)   Encounter for hepatitis C screening test for low risk patient       Relevant Orders   Hepatitis C antibody   Daytime sleepiness       Relevant Orders   CBC w/Diff/Platelet   Comprehensive Metabolic Panel (CMET)   Ambulatory referral to Sleep Studies      Return in about 6 months (around 02/05/2022) for chronic conditions.  I, Alfredia FergusonLindsay Ahmia Colford, PA-C have reviewed all documentation for this  visit. The documentation on  08/06/2021  for the exam, diagnosis, procedures, and orders are all accurate and complete.  Alfredia FergusonLindsay Baneen Wieseler, PA-C [facility] [address] [place], [place], [address] Office: [phone] Fax: [phone]

## 2021-08-06 NOTE — Assessment & Plan Note (Signed)
initially elevated but then improved Will monitor in 6 mo

## 2021-08-06 NOTE — Assessment & Plan Note (Signed)
Discussed diet and exercise at length. Pt aware he needs to make changes. Likely contributing to possible sleep apnea. Will check labs

## 2021-08-06 NOTE — Assessment & Plan Note (Signed)
Recommended compression socks. Will refer to vascular for treatment per pt

## 2021-08-07 DIAGNOSIS — R400 Somnolence: Secondary | ICD-10-CM | POA: Diagnosis not present

## 2021-08-07 DIAGNOSIS — R635 Abnormal weight gain: Secondary | ICD-10-CM | POA: Diagnosis not present

## 2021-08-07 DIAGNOSIS — Z1159 Encounter for screening for other viral diseases: Secondary | ICD-10-CM | POA: Diagnosis not present

## 2021-08-07 DIAGNOSIS — Z114 Encounter for screening for human immunodeficiency virus [HIV]: Secondary | ICD-10-CM | POA: Diagnosis not present

## 2021-08-08 LAB — LIPID PANEL
Chol/HDL Ratio: 6.4 ratio — ABNORMAL HIGH (ref 0.0–5.0)
Cholesterol, Total: 217 mg/dL — ABNORMAL HIGH (ref 100–199)
HDL: 34 mg/dL — ABNORMAL LOW (ref >39)
LDL Chol Calc (NIH): 143 mg/dL — ABNORMAL HIGH (ref 0–99)
Triglycerides: 218 mg/dL — ABNORMAL HIGH (ref 0–149)
VLDL Cholesterol Cal: 40 mg/dL (ref 5–40)

## 2021-08-08 LAB — CBC WITH DIFFERENTIAL/PLATELET
Basophils Absolute: 0 10*3/uL (ref 0.0–0.2)
Basos: 1 %
EOS (ABSOLUTE): 0.1 10*3/uL (ref 0.0–0.4)
Eos: 1 %
Hematocrit: 46.7 % (ref 37.5–51.0)
Hemoglobin: 16.1 g/dL (ref 13.0–17.7)
Immature Grans (Abs): 0 10*3/uL (ref 0.0–0.1)
Immature Granulocytes: 1 %
Lymphocytes Absolute: 1.8 10*3/uL (ref 0.7–3.1)
Lymphs: 27 %
MCH: 28 pg (ref 26.6–33.0)
MCHC: 34.5 g/dL (ref 31.5–35.7)
MCV: 81 fL (ref 79–97)
Monocytes Absolute: 0.6 10*3/uL (ref 0.1–0.9)
Monocytes: 9 %
Neutrophils Absolute: 4.1 10*3/uL (ref 1.4–7.0)
Neutrophils: 61 %
Platelets: 272 10*3/uL (ref 150–450)
RBC: 5.75 x10E6/uL (ref 4.14–5.80)
RDW: 13 % (ref 11.6–15.4)
WBC: 6.7 10*3/uL (ref 3.4–10.8)

## 2021-08-08 LAB — TSH+FREE T4
Free T4: 1.33 ng/dL (ref 0.82–1.77)
TSH: 1.74 u[IU]/mL (ref 0.450–4.500)

## 2021-08-08 LAB — HEMOGLOBIN A1C
Est. average glucose Bld gHb Est-mCnc: 103 mg/dL
Hgb A1c MFr Bld: 5.2 % (ref 4.8–5.6)

## 2021-08-08 LAB — COMPREHENSIVE METABOLIC PANEL
ALT: 34 IU/L (ref 0–44)
AST: 20 IU/L (ref 0–40)
Albumin/Globulin Ratio: 1.5 (ref 1.2–2.2)
Albumin: 4.4 g/dL (ref 4.0–5.0)
Alkaline Phosphatase: 77 IU/L (ref 44–121)
BUN/Creatinine Ratio: 17 (ref 9–20)
BUN: 17 mg/dL (ref 6–20)
Bilirubin Total: 0.6 mg/dL (ref 0.0–1.2)
CO2: 23 mmol/L (ref 20–29)
Calcium: 9.4 mg/dL (ref 8.7–10.2)
Chloride: 101 mmol/L (ref 96–106)
Creatinine, Ser: 1.02 mg/dL (ref 0.76–1.27)
Globulin, Total: 3 g/dL (ref 1.5–4.5)
Glucose: 100 mg/dL — ABNORMAL HIGH (ref 70–99)
Potassium: 4.5 mmol/L (ref 3.5–5.2)
Sodium: 138 mmol/L (ref 134–144)
Total Protein: 7.4 g/dL (ref 6.0–8.5)
eGFR: 96 mL/min/{1.73_m2} (ref >59)

## 2021-08-08 LAB — HEPATITIS C ANTIBODY: Hep C Virus Ab: NONREACTIVE

## 2021-08-08 LAB — HIV ANTIBODY (ROUTINE TESTING W REFLEX): HIV Screen 4th Generation wRfx: NONREACTIVE

## 2021-08-26 ENCOUNTER — Other Ambulatory Visit: Payer: Self-pay | Admitting: Physician Assistant

## 2021-08-26 DIAGNOSIS — R0681 Apnea, not elsewhere classified: Secondary | ICD-10-CM

## 2021-08-26 NOTE — Progress Notes (Signed)
Pt insurance declined in-person sleep study, sending order to [facility] for in home sleep study

## 2021-09-27 DIAGNOSIS — R059 Cough, unspecified: Secondary | ICD-10-CM | POA: Diagnosis not present

## 2021-09-27 DIAGNOSIS — J069 Acute upper respiratory infection, unspecified: Secondary | ICD-10-CM | POA: Diagnosis not present

## 2021-09-28 DIAGNOSIS — H66001 Acute suppurative otitis media without spontaneous rupture of ear drum, right ear: Secondary | ICD-10-CM | POA: Diagnosis not present

## 2021-10-01 DIAGNOSIS — G4733 Obstructive sleep apnea (adult) (pediatric): Secondary | ICD-10-CM | POA: Diagnosis not present

## 2021-10-20 ENCOUNTER — Other Ambulatory Visit: Payer: Self-pay | Admitting: Physician Assistant

## 2021-10-20 DIAGNOSIS — G4733 Obstructive sleep apnea (adult) (pediatric): Secondary | ICD-10-CM | POA: Insufficient documentation

## 2021-11-06 DIAGNOSIS — G4733 Obstructive sleep apnea (adult) (pediatric): Secondary | ICD-10-CM | POA: Diagnosis not present

## 2021-11-06 DIAGNOSIS — G4710 Hypersomnia, unspecified: Secondary | ICD-10-CM | POA: Diagnosis not present

## 2021-11-24 ENCOUNTER — Other Ambulatory Visit (INDEPENDENT_AMBULATORY_CARE_PROVIDER_SITE_OTHER): Payer: Self-pay | Admitting: Nurse Practitioner

## 2021-11-24 DIAGNOSIS — I83891 Varicose veins of right lower extremities with other complications: Secondary | ICD-10-CM

## 2021-11-25 ENCOUNTER — Ambulatory Visit (INDEPENDENT_AMBULATORY_CARE_PROVIDER_SITE_OTHER): Payer: Self-pay

## 2021-11-25 ENCOUNTER — Ambulatory Visit (INDEPENDENT_AMBULATORY_CARE_PROVIDER_SITE_OTHER): Payer: BC Managed Care – PPO | Admitting: Nurse Practitioner

## 2021-11-25 ENCOUNTER — Encounter (INDEPENDENT_AMBULATORY_CARE_PROVIDER_SITE_OTHER): Payer: Self-pay | Admitting: Nurse Practitioner

## 2021-11-25 VITALS — BP 125/79 | HR 66 | Resp 17 | Ht 74.0 in | Wt 299.2 lb

## 2021-11-25 DIAGNOSIS — I83813 Varicose veins of bilateral lower extremities with pain: Secondary | ICD-10-CM

## 2021-11-25 DIAGNOSIS — I83891 Varicose veins of right lower extremities with other complications: Secondary | ICD-10-CM

## 2021-11-26 ENCOUNTER — Encounter (INDEPENDENT_AMBULATORY_CARE_PROVIDER_SITE_OTHER): Payer: Self-pay | Admitting: Nurse Practitioner

## 2021-11-26 NOTE — Progress Notes (Signed)
Subjective:    Patient ID: Dennis Gardner, male    DOB: 1980/05/16, 41 y.o.   MRN: [id number] Chief Complaint  Patient presents with   Establish Care    Referred by Dr Thedore Mins    The patient is seen for evaluation of symptomatic varicose veins. The patient relates burning and stinging which worsened steadily throughout the course of the day, particularly with standing. The patient also notes an aching and throbbing pain over the varicosities, particularly with prolonged dependent positions. The symptoms are significantly improved with elevation.  The patient also notes that during hot weather the symptoms are greatly intensified. The patient states the pain from the varicose veins interferes with work, daily exercise, shopping and household maintenance. At this point, the symptoms are persistent and severe enough that they're having a negative impact on lifestyle and are interfering with daily activities.  He notes that the swelling has been persistent for the last 2 years and the pain has steadily worsened over the last year.  He notes he has had these varicose veins however for approximately 10 years.  There is no history of DVT, PE or superficial thrombophlebitis. There is no history of ulceration or hemorrhage. The patient denies a significant family history of varicose veins.  The patient has worn graduated compression socks daily for years.  He notes that it is no longer helping with the symptoms of pain and swelling.  He also elevates his lower extremities when possible in addition to taking over-the-counter pain medication.  Today noninvasive studies show he has evidence of deep venous insufficiency in the right lower extremity.  No evidence of DVT or superficial thrombophlebitis.  He has evidence of extensive significant reflux in the great saphenous vein extending from the saphenofemoral junction to the proximal calf.      Review of Systems  Cardiovascular:  Positive for leg swelling.   All other systems reviewed and are negative.      Objective:   Physical Exam Vitals reviewed.  HENT:     Head: Normocephalic.  Cardiovascular:     Rate and Rhythm: Normal rate.     Pulses: Normal pulses.  Pulmonary:     Effort: Pulmonary effort is normal.  Skin:    General: Skin is warm and dry.     Comments: Stasis dermatitis right lower extremity  Neurological:     Mental Status: He is alert and oriented to person, place, and time.  Psychiatric:        Mood and Affect: Mood normal.        Behavior: Behavior normal.        Thought Content: Thought content normal.        Judgment: Judgment normal.     BP 125/79 (BP Location: Left Arm)   Pulse 66   Resp 17   Ht 6\' 2"  (1.88 m)   Wt 299 lb 3.2 oz (135.7 kg)   BMI 38.42 kg/m   Past Medical History:  Diagnosis Date   Asthma    Thyroid disease     Social History   Socioeconomic History   Marital status: Married    Spouse name: Not on file   Number of children: Not on file   Years of education: Not on file   Highest education level: Not on file  Occupational History   Not on file  Tobacco Use   Smoking status: Some Days   Smokeless tobacco: Never   Tobacco comments:    very social, maybe 3 a  month   Vaping Use   Vaping Use: Never used  Substance and Sexual Activity   Alcohol use: Yes   Drug use: No   Sexual activity: Yes  Other Topics Concern   Not on file  Social History Narrative   Not on file   Social Determinants of Health   Financial Resource Strain: Not on file  Food Insecurity: Not on file  Transportation Needs: Not on file  Physical Activity: Not on file  Stress: Not on file  Social Connections: Not on file  Intimate Partner Violence: Not on file    Past Surgical History:  Procedure Laterality Date   COLONOSCOPY WITH PROPOFOL N/A 06/24/2017   Procedure: COLONOSCOPY WITH PROPOFOL;  Surgeon: Wyline Mood, MD;  Location: [facility] ENDOSCOPY;  Service: Gastroenterology;  Laterality: N/A;    parathyroid excision     PARATHYROIDECTOMY      Family History  Problem Relation Age of Onset   Hypertension Mother    Diabetes Father     No Known Allergies     Latest Ref Rng & Units 08/07/2021    8:53 AM 04/06/2017    2:46 PM 04/04/2017    8:55 AM  CBC  WBC 3.4 - 10.8 x10E3/uL 6.7  5.4  5.2   Hemoglobin 13.0 - 17.7 g/dL 62.6  94.8  54.6   Hematocrit 37.5 - 51.0 % 46.7  44.2  46.1   Platelets 150 - 450 x10E3/uL 272  228  217       CMP     Component Value Date/Time   NA 138 08/07/2021 0853   K 4.5 08/07/2021 0853   CL 101 08/07/2021 0853   CO2 23 08/07/2021 0853   GLUCOSE 100 (H) 08/07/2021 0853   GLUCOSE 95 04/06/2017 1446   BUN 17 08/07/2021 0853   CREATININE 1.02 08/07/2021 0853   CALCIUM 9.4 08/07/2021 0853   PROT 7.4 08/07/2021 0853   ALBUMIN 4.4 08/07/2021 0853   AST 20 08/07/2021 0853   ALT 34 08/07/2021 0853   ALKPHOS 77 08/07/2021 0853   BILITOT 0.6 08/07/2021 0853   GFRNONAA >60 04/06/2017 1446   GFRAA >60 04/06/2017 1446     No results found.     Assessment & Plan:   1. Varicose veins of both lower extremities with pain Recommend  I have reviewed my  discussion with the patient regarding  varicose veins and why they cause symptoms. Patient will continue  wearing graduated compression stockings class 1 on a daily basis, beginning first thing in the morning and removing them in the evening.    Ceap 4asEpAPr  In addition, behavioral modification including elevation during the day was again discussed and this will continue.  The patient has utilized over the counter pain medications and has been exercising.  However, at this time conservative therapy has not alleviated the patient's symptoms of leg pain and swelling  Recommend: laser ablation of the right  great saphenous veins to eliminate the symptoms of pain and swelling of the lower extremities caused by the severe superficial venous reflux disease.   - VAS [place] LOWER EXTREMITY VENOUS  REFLUX   Current Outpatient Medications on File Prior to Visit  Medication Sig Dispense Refill   traZODone (DESYREL) 50 MG tablet Take 0.5-1 tablets (25-50 mg total) by mouth at bedtime as needed for sleep. (Patient not taking: Reported on 11/25/2021) 30 tablet 3   No current facility-administered medications on file prior to visit.    There are no Patient Instructions on  file for this visit. No follow-ups on file.   Kris Hartmann, NP

## 2021-12-06 DIAGNOSIS — G4710 Hypersomnia, unspecified: Secondary | ICD-10-CM | POA: Diagnosis not present

## 2021-12-06 DIAGNOSIS — G4733 Obstructive sleep apnea (adult) (pediatric): Secondary | ICD-10-CM | POA: Diagnosis not present

## 2022-01-06 DIAGNOSIS — G4733 Obstructive sleep apnea (adult) (pediatric): Secondary | ICD-10-CM | POA: Diagnosis not present

## 2022-01-06 DIAGNOSIS — G4710 Hypersomnia, unspecified: Secondary | ICD-10-CM | POA: Diagnosis not present

## 2022-01-13 ENCOUNTER — Encounter: Payer: Self-pay | Admitting: Physician Assistant

## 2022-01-13 ENCOUNTER — Ambulatory Visit: Payer: BC Managed Care – PPO | Admitting: Physician Assistant

## 2022-01-13 VITALS — BP 146/92 | HR 81 | Ht 74.0 in | Wt 301.2 lb

## 2022-01-13 DIAGNOSIS — E669 Obesity, unspecified: Secondary | ICD-10-CM

## 2022-01-13 DIAGNOSIS — Z23 Encounter for immunization: Secondary | ICD-10-CM | POA: Diagnosis not present

## 2022-01-13 DIAGNOSIS — I10 Essential (primary) hypertension: Secondary | ICD-10-CM | POA: Diagnosis not present

## 2022-01-13 DIAGNOSIS — G4733 Obstructive sleep apnea (adult) (pediatric): Secondary | ICD-10-CM

## 2022-01-13 DIAGNOSIS — E7849 Other hyperlipidemia: Secondary | ICD-10-CM | POA: Diagnosis not present

## 2022-01-13 MED ORDER — WEGOVY 0.25 MG/0.5ML ~~LOC~~ SOAJ: 0.2500 mg | SUBCUTANEOUS | 0 refills | Status: DC

## 2022-01-13 NOTE — Progress Notes (Unsigned)
     I,Sha'taria Tyson,acting as a Neurosurgeon for [employer], PA-C.,have documented all relevant documentation on the behalf of Alfredia Ferguson, PA-C,as directed by  Alfredia Ferguson, PA-C while in the presence of Alfredia Ferguson, PA-C.   Established patient visit   Patient: Dennis Gardner   DOB: 09/22/1980   41 y.o. Male  MRN: [id number] Visit Date: 01/13/2022  Today's healthcare provider: Alfredia Ferguson, PA-C   No chief complaint on file.  Subjective    HPI  CPAP Check in  -Pt reports consistent use but will sometimes take it off in the middle of the night.  Claustrophobic, was doing mask now pillow  2.5 hours    Medications: Outpatient Medications Prior to Visit  Medication Sig   traZODone (DESYREL) 50 MG tablet Take 0.5-1 tablets (25-50 mg total) by mouth at bedtime as needed for sleep. (Patient not taking: Reported on 11/25/2021)   No facility-administered medications prior to visit.    Review of Systems  {Labs  Heme  Chem  Endocrine  Serology  Results Review (optional):23779}   Objective    There were no vitals taken for this visit. {Show previous vital signs (optional):23777}  Physical Exam  ***  No results found for any visits on 01/13/22.  Assessment & Plan     ***  No follow-ups on file.      {provider attestation***:1}   Alfredia Ferguson, PA-C  [facility] [phone] (phone) [phone] (fax)  [facility]

## 2022-01-14 ENCOUNTER — Encounter: Payer: Self-pay | Admitting: Physician Assistant

## 2022-01-14 ENCOUNTER — Other Ambulatory Visit: Payer: Self-pay | Admitting: Physician Assistant

## 2022-01-14 ENCOUNTER — Telehealth: Payer: Self-pay

## 2022-01-14 DIAGNOSIS — I10 Essential (primary) hypertension: Secondary | ICD-10-CM | POA: Insufficient documentation

## 2022-01-14 DIAGNOSIS — E7849 Other hyperlipidemia: Secondary | ICD-10-CM

## 2022-01-14 DIAGNOSIS — E669 Obesity, unspecified: Secondary | ICD-10-CM | POA: Insufficient documentation

## 2022-01-14 DIAGNOSIS — G4733 Obstructive sleep apnea (adult) (pediatric): Secondary | ICD-10-CM

## 2022-01-14 MED ORDER — WEGOVY 0.25 MG/0.5ML ~~LOC~~ SOAJ: 0.2500 mg | SUBCUTANEOUS | 0 refills | Status: DC

## 2022-01-14 NOTE — Assessment & Plan Note (Signed)
On review of labs, HLD currently unmediated, goals were increasing exercise and better control of diet.

## 2022-01-14 NOTE — Assessment & Plan Note (Signed)
On average use is 2.5 hours a night Discussed methods of better compliance.  Discussed benefits of increased compliance

## 2022-01-14 NOTE — Assessment & Plan Note (Signed)
Stressed to patient he check BP at home. Advised weight loss, better diet (DASH diet) and exercise will improve BP without meds. Advised if he continues to have elevated bp in office and at home, would likely start acei/arb.

## 2022-01-14 NOTE — Telephone Encounter (Signed)
Copied from CRM [id number]. Topic: General - Other >> Jan 14, 2022  4:04 PM Dennis Gardner wrote: Reason for CRM: pt called saying [facility] not [facility] his Wegovy.   He wants to know if there is something else available or if you know where may have it in stock.\  CB@  [phone]

## 2022-01-14 NOTE — Assessment & Plan Note (Addendum)
Discussed diet, exercise, lifestyle changes. Discussed various medicaitons. Pt is ineligible for stimulants ie phentermine d/t HTN.  Pt likely not a candidate for contrave given he doesn't struggle as much with cravings/overeating.  Really a good candidate for GLP-1 meds, given metabolic syndrome-- HTN, HLD, obesity, OSA. Discussed supply issues w/ wegovy, pending FDA approval of Mounjaro.  Pt would prefer starting meds sooner than later, rx wegovy 0.25 mg. Advised MOA, titration schedule, side effects.

## 2022-01-19 NOTE — Telephone Encounter (Signed)
Pt returned call reporting that [facility] does not accept his insurance and that the medication would be very expensive. They actually do not even have it in stock. So he is asking for an alternative option instead of wegovy; [facility].

## 2022-01-19 NOTE — Telephone Encounter (Signed)
LVMTCB. CRM created. Ok for [facility] to advise upon patient returning call

## 2022-01-20 ENCOUNTER — Other Ambulatory Visit: Payer: Self-pay | Admitting: Physician Assistant

## 2022-01-20 ENCOUNTER — Telehealth: Payer: Self-pay | Admitting: Physician Assistant

## 2022-01-20 DIAGNOSIS — I10 Essential (primary) hypertension: Secondary | ICD-10-CM

## 2022-01-20 DIAGNOSIS — G4733 Obstructive sleep apnea (adult) (pediatric): Secondary | ICD-10-CM

## 2022-01-20 DIAGNOSIS — E669 Obesity, unspecified: Secondary | ICD-10-CM

## 2022-01-20 MED ORDER — INSULIN PEN NEEDLE 32G X 6 MM MISC: 1 refills | Status: DC

## 2022-01-20 MED ORDER — SAXENDA 18 MG/3ML ~~LOC~~ SOPN: PEN_INJECTOR | SUBCUTANEOUS | 1 refills | Status: DC

## 2022-01-20 NOTE — Telephone Encounter (Signed)
Patient advised. Verbalized understanding 

## 2022-01-20 NOTE — Telephone Encounter (Signed)
Saxenda in not on current list. Routing for review.

## 2022-01-20 NOTE — Telephone Encounter (Signed)
Please see previous telephone encounter.

## 2022-01-20 NOTE — Telephone Encounter (Signed)
Copied from CRM [id number]. Topic: General - Other >> Jan 20, 2022  8:46 AM Everette C wrote: Reason for CRM: Medication Refill - Medication: Saxenda   Has the patient contacted their pharmacy? No. (Agent: If no, request that the patient contact the pharmacy for the refill. If patient does not wish to contact the pharmacy document the reason why and proceed with request.) (Agent: If yes, when and what did the pharmacy advise?)  Preferred Pharm[facility] (with phone number or street name[id number] [place] [place] [facility] [address] [place] [address] Phone: [phone] Fax: [phone] Hours: Not open 24 hours   Has the patient been seen for an appointment in the last year OR does the patient have an upcoming appointment? Yes.    Agent: Please be advised that RX refills may take up to 3 business days. We ask that you follow-up with your pharmacy.

## 2022-02-05 ENCOUNTER — Ambulatory Visit: Payer: BC Managed Care – PPO | Admitting: Physician Assistant

## 2022-02-05 DIAGNOSIS — G4733 Obstructive sleep apnea (adult) (pediatric): Secondary | ICD-10-CM | POA: Diagnosis not present

## 2022-02-24 ENCOUNTER — Telehealth (INDEPENDENT_AMBULATORY_CARE_PROVIDER_SITE_OTHER): Payer: Self-pay | Admitting: Nurse Practitioner

## 2022-02-24 NOTE — Telephone Encounter (Signed)
PT LVMOM inquiring about his laser procedure and when he could have it scheduled. I returned call to patient and had to LVM stating that we are still in a backorder situation for the laser ablation procedure and that I would call him as soon as I know when I can start scheduling the procedures.

## 2022-02-25 ENCOUNTER — Telehealth (INDEPENDENT_AMBULATORY_CARE_PROVIDER_SITE_OTHER): Payer: Self-pay | Admitting: Nurse Practitioner

## 2022-02-25 NOTE — Telephone Encounter (Signed)
LVM for pt in regards to his VM inquiring if we could send out a referral to somewhere that has the supplies to perform the laser ablation. I advised that [facility] is part of [facility] and I do not know if they have the same problems or not. I also gave [facility]. I did ask for a call back in order to let us kow where he would like the referral sent.

## 2022-03-08 DIAGNOSIS — G4733 Obstructive sleep apnea (adult) (pediatric): Secondary | ICD-10-CM | POA: Diagnosis not present

## 2022-03-29 ENCOUNTER — Other Ambulatory Visit: Payer: Self-pay | Admitting: Physician Assistant

## 2022-03-29 ENCOUNTER — Telehealth: Payer: Self-pay | Admitting: Physician Assistant

## 2022-03-29 DIAGNOSIS — E669 Obesity, unspecified: Secondary | ICD-10-CM

## 2022-03-29 MED ORDER — SEMAGLUTIDE-WEIGHT MANAGEMENT 0.25 MG/0.5ML ~~LOC~~ SOAJ: 0.2500 mg | SUBCUTANEOUS | 0 refills | Status: DC

## 2022-03-29 NOTE — Telephone Encounter (Signed)
Prescribed wegovy but unlikely more affordable

## 2022-03-29 NOTE — Telephone Encounter (Unsigned)
Copied from CRM #[id number]. Topic: General - Other >> Mar 29, 2022  8:31 AM Everette C wrote: Reason for CRM: The patient has called to share that they have spoken with their insurance regarding their Liraglutide -Weight Management (SAXENDA) 18 MG/3ML SOPN [[id number]] prescription   The patient was told that the cost of this medication would be $1200 including their insurance   The patient was directed to contact their PCP and request alternative prescriptions of Mounjaro or Wegovy for coverage  Please contact the patient further when possible 

## 2022-03-29 NOTE — Telephone Encounter (Signed)
Copied from [place] [id number]. Topic: General - Other >> Mar 29, 2022  8:31 AM Dennis Gardner wrote: Reason for CRM: The patient has called to share that they have spoken with their insurance regarding their Liraglutide -Weight Management ([place]) 18 MG/3ML SOPN [[id number]] prescription   The patient was told that the cost of this medication would be $1200 including their insurance   The patient was directed to contact their PCP and request alternative prescriptions of Mounjaro or [facility] for coverage  Please contact the patient further when possible

## 2022-03-30 NOTE — Telephone Encounter (Signed)
LVM

## 2022-04-02 ENCOUNTER — Telehealth: Payer: Self-pay | Admitting: Physician Assistant

## 2022-04-02 NOTE — Telephone Encounter (Signed)
Received a fax from covermymeds.    Key: [id number]  Needs additional action

## 2022-04-06 NOTE — Telephone Encounter (Signed)
LVM. OK for PEC to advise if patient returns call

## 2022-04-08 DIAGNOSIS — G4733 Obstructive sleep apnea (adult) (pediatric): Secondary | ICD-10-CM | POA: Diagnosis not present

## 2022-04-09 NOTE — Telephone Encounter (Signed)
Called pt and left vm to call back with rx insurance information.  The card that we have on file is correct for medical.  Verified in Epic today.  If pt calls back okay for PEC to get information from the pt.

## 2022-04-12 NOTE — Telephone Encounter (Signed)
Pt shouldn't need PA

## 2022-05-07 ENCOUNTER — Encounter: Payer: Self-pay | Admitting: Physician Assistant

## 2022-05-07 ENCOUNTER — Other Ambulatory Visit: Payer: Self-pay | Admitting: Physician Assistant

## 2022-05-07 ENCOUNTER — Ambulatory Visit: Payer: BC Managed Care – PPO | Admitting: Physician Assistant

## 2022-05-07 VITALS — BP 136/85 | Ht 74.0 in | Wt 304.0 lb

## 2022-05-07 DIAGNOSIS — Z6839 Body mass index (BMI) 39.0-39.9, adult: Secondary | ICD-10-CM

## 2022-05-07 DIAGNOSIS — E669 Obesity, unspecified: Secondary | ICD-10-CM

## 2022-05-07 DIAGNOSIS — G4733 Obstructive sleep apnea (adult) (pediatric): Secondary | ICD-10-CM

## 2022-05-07 DIAGNOSIS — G4700 Insomnia, unspecified: Secondary | ICD-10-CM

## 2022-05-07 DIAGNOSIS — R5383 Other fatigue: Secondary | ICD-10-CM

## 2022-05-07 DIAGNOSIS — I10 Essential (primary) hypertension: Secondary | ICD-10-CM | POA: Diagnosis not present

## 2022-05-07 MED ORDER — TIRZEPATIDE-WEIGHT MANAGEMENT 2.5 MG/0.5ML ~~LOC~~ SOAJ: 2.5000 mg | SUBCUTANEOUS | 0 refills | Status: DC

## 2022-05-07 MED ORDER — TRAZODONE HCL 50 MG PO TABS: 25.0000 mg | ORAL_TABLET | Freq: Every evening | ORAL | 3 refills | Status: AC | PRN

## 2022-05-07 NOTE — Progress Notes (Signed)
I,Sha'taria Tyson,acting as a Education administrator for [employer], PA-C.,have documented all relevant documentation on the behalf of Mikey Kirschner, PA-C,as directed by  Mikey Kirschner, PA-C while in the presence of Mikey Kirschner, PA-C.   Established patient visit   Patient: Dennis Gardner   DOB: 1980-04-26   42 y.o. Male  MRN: [id number] Visit Date: 05/07/2022  Today's healthcare provider: Mikey Kirschner, PA-C   Cc. Weight loss f/u, htn f/u  Subjective    HPI   Follow up for weight management  The patient was last seen for this 3 months ago. Pt has not been able to get any glp agents d/t cost or lack of stock.  Still reports issues with losing weight despite diet and exercise. Feels fatigued -----------------------------------------------------------------------------------------  Hypertension, follow-up  BP Readings from Last 3 Encounters:  05/07/22 136/85  01/13/22 (!) 146/92  11/25/21 125/79   Wt Readings from Last 3 Encounters:  05/07/22 (!) 304 lb (137.9 kg)  01/13/22 (!) 301 lb 3.2 oz (136.6 kg)  11/25/21 299 lb 3.2 oz (135.7 kg)     He was last seen for hypertension 3 months ago.  BP at that visit was 146/92. Management since that visit includes advised weight loss, better diet (DASH diet) and exercise will improve BP without meds. Advised if he continues to have elevated bp in office and at home, would likely start acei/arb..  Outside blood pressures are not being checked  Pertinent labs Lab Results  Component Value Date   CHOL 217 (H) 08/07/2021   HDL 34 (L) 08/07/2021   LDLCALC 143 (H) 08/07/2021   TRIG 218 (H) 08/07/2021   CHOLHDL 6.4 (H) 08/07/2021   Lab Results  Component Value Date   NA 138 08/07/2021   K 4.5 08/07/2021   CREATININE 1.02 08/07/2021   EGFR 96 08/07/2021   GLUCOSE 100 (H) 08/07/2021   TSH 1.740 08/07/2021     The ASCVD Risk score (Arnett DK, et al., 2019) failed to calculate for the following reasons:   The 2019 ASCVD risk score is  only valid for ages 40 to 61 ---------------------------------------------------------------------------------------------------   Medications: Outpatient Medications Prior to Visit  Medication Sig   [DISCONTINUED] Insulin Pen Needle 32G X 6 MM MISC To be used with saxenda injection (Patient not taking: Reported on 05/07/2022)   [DISCONTINUED] Semaglutide-Weight Management 0.25 MG/0.5ML SOAJ Inject 0.25 mg into the skin once a week. For four weeks (Patient not taking: Reported on 05/07/2022)   [DISCONTINUED] traZODone (DESYREL) 50 MG tablet Take 0.5-1 tablets (25-50 mg total) by mouth at bedtime as needed for sleep. (Patient not taking: Reported on 11/25/2021)   No facility-administered medications prior to visit.    Review of Systems  Constitutional:  Negative for fatigue and fever.  Respiratory:  Negative for cough and shortness of breath.   Cardiovascular:  Negative for chest pain, palpitations and leg swelling.  Neurological:  Negative for dizziness and headaches.      Objective    BP 136/85 (BP Location: Left Arm, Patient Position: Sitting, Cuff Size: Large)   Ht '6\' 2"'$  (1.88 m)   Wt (!) 304 lb (137.9 kg)   SpO2 98%   BMI 39.03 kg/m    Physical Exam Constitutional:      General: He is awake.     Appearance: He is well-developed.  HENT:     Head: Normocephalic.  Eyes:     Conjunctiva/sclera: Conjunctivae normal.  Cardiovascular:     Rate and Rhythm: Normal rate and regular  rhythm.     Heart sounds: Normal heart sounds.  Pulmonary:     Effort: Pulmonary effort is normal.     Breath sounds: Normal breath sounds.  Skin:    General: Skin is warm.  Neurological:     Mental Status: He is alert and oriented to person, place, and time.  Psychiatric:        Attention and Perception: Attention normal.        Mood and Affect: Mood normal.        Speech: Speech normal.        Behavior: Behavior is cooperative.     Results for orders placed or performed in visit on 05/07/22   Testosterone,Free and Total  Result Value Ref Range   Testosterone 179 (L) 264 - 916 ng/dL   Testosterone, Free 5.4 (L) 8.7 - 25.1 pg/mL    Assessment & Plan     Problem List Items Addressed This Visit       Cardiovascular and Mediastinum   Primary hypertension - Primary    Moderate control today F/u 6 mo        Respiratory   OSA (obstructive sleep apnea)    Still reports fatigue. Uses cpap 4-6 hours a night        Other   Insomnia   Relevant Medications   traZODone (DESYREL) 50 MG tablet   Morbid obesity (HCC)    Pt has morbid obesity d/t a BMI was 39.03, HTN, HLD, and OSA.  He has been prescribed both wegovy and saxenda but has been unable to start either medication 2/2 supply- he cannot get the starting doses from any local pharmacies. He is not eligible for Qsymia as he has HTN. Adding phentermine would increase his blood pressure even more than it already is.  I think given the above he would be a good candidate for Zepbound, the next step medication in this category.        Relevant Medications   tirzepatide (ZEPBOUND) 2.5 MG/0.5ML Pen   Other Visit Diagnoses     Other fatigue       Relevant Orders   Testosterone,Free and Total (Completed)   BMI 39.0-39.9,adult       Relevant Medications   tirzepatide (ZEPBOUND) 2.5 MG/0.5ML Pen       Return in about 6 months (around 11/07/2022) for chronic conditions, CPE.      I, Mikey Kirschner, PA-C have reviewed all documentation for this visit. The documentation on  05/07/22  for the exam, diagnosis, procedures, and orders are all accurate and complete.  Mikey Kirschner, PA-C [facility] [address] [place], [place], [address] Office: [phone] Fax: [place]

## 2022-05-07 NOTE — Assessment & Plan Note (Signed)
Moderate control today F/u 6 mo

## 2022-05-07 NOTE — Telephone Encounter (Signed)
Requested medication (s) are due for refill today: yes  Requested medication (s) are on the active medication list: yes  Last refill:  05/07/22  Future visit scheduled: yes  Notes to clinic:  Pharmacy comment: Alternative Requested:MED NOT COVERED.      Requested Prescriptions  Pending Prescriptions Disp Refills   SAXENDA 18 MG/3ML SOPN [Pharmacy Med Name: SAXENDA 18 MG/3 ML PEN]  0     Endocrinology:  Diabetes - GLP-1 Receptor Agonists Failed - 05/07/2022 10:53 AM      Failed - HBA1C is between 0 and 7.9 and within 180 days    Hgb A1c MFr Bld  Date Value Ref Range Status  08/07/2021 5.2 4.8 - 5.6 % Final    Comment:             Prediabetes: 5.7 - 6.4          Diabetes: >6.4          Glycemic control for adults with diabetes: <7.0          Passed - Valid encounter within last 6 months    Recent Outpatient Visits           Today Primary hypertension   [facility] Mikey Kirschner, PA-C   3 months ago OSA (obstructive sleep apnea)   [facility] Mikey Kirschner, PA-C   9 months ago Apneic episode   [facility] Mikey Kirschner, [place]

## 2022-05-07 NOTE — Assessment & Plan Note (Signed)
Still reports fatigue. Uses cpap 4-6 hours a night

## 2022-05-07 NOTE — Assessment & Plan Note (Signed)
Will try zepbound given pt has been unable to get wegovy or saxenda 2/2 supply

## 2022-05-07 NOTE — Patient Instructions (Addendum)
[email or web address]

## 2022-05-09 LAB — TESTOSTERONE,FREE AND TOTAL
Testosterone, Free: 5.4 pg/mL — ABNORMAL LOW (ref 8.7–25.1)
Testosterone: 179 ng/dL — ABNORMAL LOW (ref 264–916)

## 2022-05-10 NOTE — Addendum Note (Signed)
Addended by: Barnie Mort on: 05/10/2022 08:37 AM   Modules accepted: Orders

## 2022-05-12 ENCOUNTER — Encounter: Payer: Self-pay | Admitting: Physician Assistant

## 2022-05-12 ENCOUNTER — Other Ambulatory Visit: Payer: Self-pay | Admitting: Physician Assistant

## 2022-05-12 ENCOUNTER — Telehealth: Payer: Self-pay | Admitting: *Deleted

## 2022-05-12 DIAGNOSIS — R7989 Other specified abnormal findings of blood chemistry: Secondary | ICD-10-CM

## 2022-05-12 NOTE — Telephone Encounter (Signed)
Patient returned call and notified: Testosterone levels are low. He saw a fertility specialist for this before I believe, if he wants to look into it further, I would refer to urology. Ok to place referral for low testosterone if pt would like  Patient would like to do referral to urology

## 2022-05-24 ENCOUNTER — Telehealth: Payer: Self-pay

## 2022-05-24 NOTE — Telephone Encounter (Signed)
Patient advised. Verbalized understanding

## 2022-05-24 NOTE — Telephone Encounter (Signed)
-----   Message from Mikey Kirschner, PA-C sent at 05/24/2022 11:39 AM EDT ----- Please let him know his insurance covers zepbound now. There is a savings card online if it is still expensive

## 2022-06-07 DIAGNOSIS — G4733 Obstructive sleep apnea (adult) (pediatric): Secondary | ICD-10-CM | POA: Diagnosis not present

## 2022-06-11 ENCOUNTER — Encounter: Payer: Self-pay | Admitting: Urology

## 2022-06-11 ENCOUNTER — Ambulatory Visit: Payer: BC Managed Care – PPO | Admitting: Urology

## 2022-06-11 VITALS — BP 131/85 | HR 74 | Ht 74.0 in | Wt 294.0 lb

## 2022-06-11 DIAGNOSIS — E291 Testicular hypofunction: Secondary | ICD-10-CM | POA: Diagnosis not present

## 2022-06-11 DIAGNOSIS — R7989 Other specified abnormal findings of blood chemistry: Secondary | ICD-10-CM | POA: Diagnosis not present

## 2022-06-11 NOTE — Progress Notes (Signed)
I, DeAsia L Maxie,acting as a scribe for Riki Altes, MD.,have documented all relevant documentation on the behalf of Riki Altes, MD,as directed by  Riki Altes, MD while in the presence of Riki Altes, MD.   Marcelle Overlie Plume,acting as a scribe for Riki Altes, MD.,have documented all relevant documentation on the behalf of Riki Altes, MD,as directed by  Riki Altes, MD while in the presence of Riki Altes, MD.  06/11/2022 10:21 AM   [name] 17-Oct-1980 [id number]  Referring provider: Alfredia Ferguson, PA-C [address] [place],  [place] [address]  Chief Complaint  Patient presents with   Hypogonadism    HPI: Dennis Gardner is a 42 y.o. male who presents for evaluation of hypogonadism.  Recent PCP visit with complaints of difficulty losing weight with diet and exercise, increased fatigue, and decreased libido Has three children and is not planning on having more Total testosterone 05/07/22 was low at 179 ng/dL   PMH: Past Medical History:  Diagnosis Date   Asthma    Thyroid disease     Surgical History: Past Surgical History:  Procedure Laterality Date   COLONOSCOPY WITH PROPOFOL N/A 06/24/2017   Procedure: COLONOSCOPY WITH PROPOFOL;  Surgeon: Wyline Mood, MD;  Location[facility]COPY;  Service: Gastroenterology;  Laterality: N/A;   parathyroid excision     PARATHYROIDECTOMY      Home Medications:  Allergies as of 06/11/2022   No Known Allergies      Medication List        Accurate as of June 11, 2022 10:21 AM. If you have any questions, ask your nurse or doctor.          tirzepatide 2.5 MG/0.5ML Pen Commonly known as: ZEPBOUND Inject 2.5 mg into the skin once a week.   traZODone 50 MG tablet Commonly known as: DESYREL Take 0.5-1 tablets (25-50 mg total) by mouth at bedtime as needed for sleep.        Family History: Family History  Problem Relation Age of Onset   Hypertension Mother    Diabetes Father      Social History:  reports that he has been smoking. He has never used smokeless tobacco. He reports current alcohol use. He reports that he does not use drugs.   Physical Exam: BP 131/85   Pulse 74   Ht  (1.88 m)   Wt 294 lb (133.4 kg)   BMI 37.75 kg/m   Constitutional:  Alert and oriented, No acute distress. HEENT: [place] AT, moist mucus membranes.  Trachea midline, no masses. Cardiovascular: No clubbing, cyanosis, or edema. Respiratory: Normal respiratory effort, no increased work of breathing. GI: Abdomen is soft, nontender, nondistended, no abdominal masses GU: Testes descended bilaterally without masses or tenderness, estimated volume >20 cc bilaterally. Moderate left varicocele. Skin: No rashes, bruises or suspicious lesions. Neurologic: Grossly intact, no focal deficits, moving all 4 extremities. Psychiatric: Normal mood and affect.  Assessment & Plan:    1. Hypogonadism Symptoms: tiredness, fatigue, decreased libido We discussed the diagnosis of testosterone is based on 2 abnormal total testosterone levels drawn in the a.m. admitted with signs and symptoms of low testosterone. We discussed various forms of testosterone placement including topical preparations, intramuscular injections, subcutaneous injections, subcutaneous pellet implantation and oral testosterone.  Pros and cons of each form were discussed.  The risk of transference of topical testosterone. I had an extensive discussion regarding testosterone replacement therapy including the following: Treatment may result  in improvements in erectile function, low sex drive, anemia, bone mineral density, lean body mass, and depressive symptoms; evidence is inconclusive whether testosterone therapy improves cognitive function, measures of diabetes, energy, fatigue, lipid profiles, and quality of life measures; there is no conclusive evidence linking testosterone therapy to the development of prostate cancer; there is no  definitive evidence linking testosterone therapy to a higher incidence of venothrombolic events; at the present time it cannot be stated definitively whether testosterone therapy increases or decreases the risk of cardiovascular events including myocardial infarction and stroke. Potential side effects were discussed including erythrocytosis, gynecomastia.  The need for regular monitoring of testosterone levels and hematocrit was discussed. Repeat testosterone level this morning with LH and estradiol If LH is normal, he is interested in a trial of Clomid   [facility] [address], [address] [place], [place] [address] [phone]

## 2022-06-12 LAB — ESTRADIOL: Estradiol: 25.8 pg/mL (ref 7.6–42.6)

## 2022-06-12 LAB — TESTOSTERONE: Testosterone: 339 ng/dL (ref 264–916)

## 2022-06-12 LAB — LUTEINIZING HORMONE: LH: 3.7 m[IU]/mL (ref 1.7–8.6)

## 2022-06-14 ENCOUNTER — Telehealth (INDEPENDENT_AMBULATORY_CARE_PROVIDER_SITE_OTHER): Payer: Self-pay | Admitting: Vascular Surgery

## 2022-06-14 NOTE — Telephone Encounter (Signed)
LVM for pt advising that no prior auth is required for laser ablation and TCB if he is ready to schedule.  Right leg GSV laser. See JD. No prior auth req.   1 week post right leg GSV laser  4 week post right leg GSV laser

## 2022-06-15 ENCOUNTER — Other Ambulatory Visit: Payer: Self-pay | Admitting: Urology

## 2022-06-15 ENCOUNTER — Telehealth: Payer: Self-pay | Admitting: *Deleted

## 2022-06-15 LAB — SPECIMEN STATUS REPORT

## 2022-06-15 LAB — TESTOSTERONE FREE, PROFILE I
Albumin: 4.7 g/dL (ref 4.1–5.1)
Sex Hormone Binding: 25.4 nmol/L (ref 16.5–55.9)
Testost., Free, Calc: 70.3 pg/mL (ref 42.3–190.0)
Testosterone: 323 ng/dL (ref 264–916)

## 2022-06-15 MED ORDER — CLOMIPHENE CITRATE 50 MG PO TABS: 25.0000 mg | ORAL_TABLET | Freq: Every day | ORAL | 0 refills | Status: DC

## 2022-06-15 NOTE — Telephone Encounter (Signed)
-----   Message from Riki Altes, MD sent at 06/15/2022  1:28 PM EDT ----- Repeat total and free testosterone levels were in the low normal range.  Since he was interested in Clomid will not need prior authorization from insurance.  Rx Clomid was sent to pharmacy.  Schedule follow-up visit 6-weeks with testosterone level prior

## 2022-06-16 NOTE — Telephone Encounter (Signed)
Pt called,  I read him the message. He understood.  I booked an Lab appt in 6 weeks and an OV a week after.

## 2022-07-02 ENCOUNTER — Encounter (INDEPENDENT_AMBULATORY_CARE_PROVIDER_SITE_OTHER): Payer: Self-pay | Admitting: Vascular Surgery

## 2022-07-02 ENCOUNTER — Ambulatory Visit (INDEPENDENT_AMBULATORY_CARE_PROVIDER_SITE_OTHER): Payer: BC Managed Care – PPO | Admitting: Vascular Surgery

## 2022-07-02 VITALS — BP 128/82 | HR 77 | Resp 16 | Wt 293.2 lb

## 2022-07-02 DIAGNOSIS — I83891 Varicose veins of right lower extremities with other complications: Secondary | ICD-10-CM

## 2022-07-02 NOTE — Progress Notes (Signed)
  Dennis Gardner is a 42 y.o. male who presents with symptomatic venous reflux  Past Medical History:  Diagnosis Date   Asthma    Thyroid disease     Past Surgical History:  Procedure Laterality Date   COLONOSCOPY WITH PROPOFOL N/A 06/24/2017   Procedure: COLONOSCOPY WITH PROPOFOL;  Surgeon: Wyline Mood, MD;  Location: [facility] ENDOSCOPY;  Service: Gastroenterology;  Laterality: N/A;   parathyroid excision     PARATHYROIDECTOMY       Current Outpatient Medications:    clomiPHENE (CLOMID) 50 MG tablet, Take 0.5 tablets (25 mg total) by mouth daily., Disp: 30 tablet, Rfl: 0   tirzepatide (ZEPBOUND) 2.5 MG/0.5ML Pen, Inject 2.5 mg into the skin once a week., Disp: 2 mL, Rfl: 0   traZODone (DESYREL) 50 MG tablet, Take 0.5-1 tablets (25-50 mg total) by mouth at bedtime as needed for sleep., Disp: 30 tablet, Rfl: 3  No Known Allergies   Varicose veins of right leg with edema   PLAN: The patient's right lower extremity was sterilely prepped and draped. The ultrasound machine was used to visualize the saphenous vein throughout its course. A segment in the upper calf was selected for access. The saphenous vein was accessed without difficulty using ultrasound guidance with a micropuncture needle. A 0.018 wire was then placed beyond the saphenofemoral junction and the needle was removed. The 65 cm sheath was then placed over the wire and the wire and dilator were removed. The laser fiber was then placed through the sheath and its tip was placed approximately 5 centimeters below the saphenofemoral junction. Tumescent anesthesia was then created with a dilute lidocaine solution. Laser energy was then delivered with constant withdrawal of the sheath and laser fiber. Approximately 1526 joules of energy were delivered over a length of 37 centimeters using a 1470 Hz VenaCure machine at 7 W. Sterile dressings were placed. The patient tolerated the procedure well without obvious complications.   Follow-up in 1 week  with post-laser duplex.

## 2022-07-07 ENCOUNTER — Other Ambulatory Visit (INDEPENDENT_AMBULATORY_CARE_PROVIDER_SITE_OTHER): Payer: Self-pay | Admitting: Vascular Surgery

## 2022-07-07 DIAGNOSIS — G4733 Obstructive sleep apnea (adult) (pediatric): Secondary | ICD-10-CM | POA: Diagnosis not present

## 2022-07-07 DIAGNOSIS — I83891 Varicose veins of right lower extremities with other complications: Secondary | ICD-10-CM

## 2022-07-09 ENCOUNTER — Ambulatory Visit (INDEPENDENT_AMBULATORY_CARE_PROVIDER_SITE_OTHER): Payer: BC Managed Care – PPO

## 2022-07-09 DIAGNOSIS — I83891 Varicose veins of right lower extremities with other complications: Secondary | ICD-10-CM | POA: Diagnosis not present

## 2022-07-12 ENCOUNTER — Telehealth (INDEPENDENT_AMBULATORY_CARE_PROVIDER_SITE_OTHER): Payer: Self-pay

## 2022-07-12 NOTE — Telephone Encounter (Signed)
Left detailed message on VM.

## 2022-07-12 NOTE — Telephone Encounter (Signed)
If he has engaged in some increased activity, this can cause some irritation.  It is possible to have pain for up to a couple of weeks post procedure.  I would recommend use of 800mg  ibuprofen every 6-8 hours and warm compresses

## 2022-07-27 ENCOUNTER — Other Ambulatory Visit: Payer: Self-pay | Admitting: Family Medicine

## 2022-07-27 DIAGNOSIS — E291 Testicular hypofunction: Secondary | ICD-10-CM

## 2022-07-28 ENCOUNTER — Other Ambulatory Visit: Payer: BC Managed Care – PPO

## 2022-07-28 ENCOUNTER — Encounter: Payer: Self-pay | Admitting: Urology

## 2022-07-30 ENCOUNTER — Ambulatory Visit (INDEPENDENT_AMBULATORY_CARE_PROVIDER_SITE_OTHER): Payer: BC Managed Care – PPO | Admitting: Nurse Practitioner

## 2022-08-04 ENCOUNTER — Ambulatory Visit: Payer: BC Managed Care – PPO | Admitting: Urology

## 2022-08-07 DIAGNOSIS — G4733 Obstructive sleep apnea (adult) (pediatric): Secondary | ICD-10-CM | POA: Diagnosis not present

## 2022-09-07 ENCOUNTER — Ambulatory Visit (INDEPENDENT_AMBULATORY_CARE_PROVIDER_SITE_OTHER): Payer: BC Managed Care – PPO | Admitting: Vascular Surgery

## 2022-09-17 ENCOUNTER — Encounter (INDEPENDENT_AMBULATORY_CARE_PROVIDER_SITE_OTHER): Payer: Self-pay | Admitting: Vascular Surgery

## 2022-09-17 ENCOUNTER — Ambulatory Visit (INDEPENDENT_AMBULATORY_CARE_PROVIDER_SITE_OTHER): Payer: BC Managed Care – PPO | Admitting: Vascular Surgery

## 2022-09-17 DIAGNOSIS — I83891 Varicose veins of right lower extremities with other complications: Secondary | ICD-10-CM

## 2022-09-20 NOTE — Assessment & Plan Note (Signed)
Recommend:  The patient has had successful ablation of the previously incompetent saphenous venous system but still has persistent symptoms of pain and swelling that are having a negative impact on daily life and daily activities.  Patient should undergo injection sclerotherapy to treat the residual varicosities. The large size will necessitate foam sclerotherapy and several treatments will likely be required.  The risks, benefits and alternative therapies were reviewed in detail with the patient.  All questions were answered.  The patient agrees to proceed with sclerotherapy at their convenience.  The patient will continue wearing the graduated compression stockings and using the over-the-counter pain medications to treat her symptoms.

## 2022-09-20 NOTE — Progress Notes (Signed)
MRN : [id number]  Dennis Gardner is a 42 y.o. (Sep 18, 1980) male who presents with chief complaint of  Chief Complaint  Patient presents with   Follow-up    4 week post laser  .  History of Present Illness: Patient returns today in follow up of his venous disease.  He underwent laser ablation of the right GSV a little over a month ago.  His leg does feel better and has less swelling. He does still have prominent residual varicosities in the right lower leg.  These are painful and itchy and he does still have some swelling in the right leg.  Current Outpatient Medications  Medication Sig Dispense Refill   clomiPHENE (CLOMID) 50 MG tablet Take 0.5 tablets (25 mg total) by mouth daily. 30 tablet 0   tirzepatide (ZEPBOUND) 2.5 MG/0.5ML Pen Inject 2.5 mg into the skin once a week. 2 mL 0   traZODone (DESYREL) 50 MG tablet Take 0.5-1 tablets (25-50 mg total) by mouth at bedtime as needed for sleep. 30 tablet 3   No current facility-administered medications for this visit.    Past Medical History:  Diagnosis Date   Asthma    Thyroid disease     Past Surgical History:  Procedure Laterality Date   COLONOSCOPY WITH PROPOFOL N/A 06/24/2017   Procedure: COLONOSCOPY WITH PROPOFOL;  Surgeon: Wyline Mood, MD;  Location: [facility] ENDOSCOPY;  Service: Gastroenterology;  Laterality: N/A;   parathyroid excision     PARATHYROIDECTOMY       Social History   Tobacco Use   Smoking status: Some Days   Smokeless tobacco: Never   Tobacco comments:    very social, maybe 3 a month   Vaping Use   Vaping status: Never Used  Substance Use Topics   Alcohol use: Yes   Drug use: No       Family History  Problem Relation Age of Onset   Hypertension Mother    Diabetes Father      No Known Allergies   REVIEW OF SYSTEMS (Negative unless checked)  Constitutional: [] Weight loss  [] Fever  [] Chills Cardiac: [] Chest pain   [] Chest pressure   [] Palpitations   [] Shortness of breath when laying flat    [] Shortness of breath at rest   [] Shortness of breath with exertion. Vascular:  [x] Pain in legs with walking   [] Pain in legs at rest   [] Pain in legs when laying flat   [] Claudication   [] Pain in feet when walking  [] Pain in feet at rest  [] Pain in feet when laying flat   [] History of DVT   [] Phlebitis   [x] Swelling in legs   [x] Varicose veins   [] Non-healing ulcers Pulmonary:   [] Uses home oxygen   [] Productive cough   [] Hemoptysis   [] Wheeze  [] COPD   [] Asthma Neurologic:  [] Dizziness  [] Blackouts   [] Seizures   [] History of stroke   [] History of TIA  [] Aphasia   [] Temporary blindness   [] Dysphagia   [] Weakness or numbness in arms   [] Weakness or numbness in legs Musculoskeletal:  [] Arthritis   [] Joint swelling   [] Joint pain   [] Low back pain Hematologic:  [] Easy bruising  [] Easy bleeding   [] Hypercoagulable state   [] Anemic   Gastrointestinal:  [] Blood in stool   [] Vomiting blood  [] Gastroesophageal reflux/heartburn   [] Abdominal pain Genitourinary:  [] Chronic kidney disease   [] Difficult urination  [] Frequent urination  [] Burning with urination   [] Hematuria Skin:  [] Rashes   [] Ulcers   [] Wounds Psychological:  [] History  of anxiety   []  History of major depression.  Physical Examination  BP 136/78 (BP Location: Left Arm)   Pulse 67   Resp 16   Wt 274 lb 12.8 oz (124.6 kg)   BMI 35.28 kg/m  Gen:  WD/WN, NAD Head: [place]/AT, No temporalis wasting. Ear/Nose/Throat: Hearing grossly intact, nares w/o erythema or drainage Eyes: Conjunctiva clear. Sclera non-icteric Neck: Supple.  Trachea midline Pulmonary:  Good air movement, no use of accessory muscles.  Cardiac: RRR, no JVD Vascular:  Vessel Right Left  Radial Palpable Palpable                          PT Palpable Palpable  DP Palpable Palpable   Gastrointestinal: soft, non-tender/non-distended. No guarding/reflex.  Musculoskeletal: M/S 5/5 throughout.  No deformity or atrophy. Large varicosities measuring about 3-4 mm in  diameter most prominent in the right medial calf area. Trace RLE edema. Neurologic: Sensation grossly intact in extremities.  Symmetrical.  Speech is fluent.  Psychiatric: Judgment intact, Mood & affect appropriate for pt's clinical situation. Dermatologic: No rashes or ulcers noted.  No cellulitis or open wounds.      Labs No results found for this or any previous visit (from the past 2160 hour(s)).  Radiology No results found.  Assessment/Plan  Varicose veins of right leg with edema Recommend:  The patient has had successful ablation of the previously incompetent saphenous venous system but still has persistent symptoms of pain and swelling that are having a negative impact on daily life and daily activities.  Patient should undergo injection sclerotherapy to treat the residual varicosities. The large size will necessitate foam sclerotherapy and several treatments will likely be required.  The risks, benefits and alternative therapies were reviewed in detail with the patient.  All questions were answered.  The patient agrees to proceed with sclerotherapy at their convenience.  The patient will continue wearing the graduated compression stockings and using the over-the-counter pain medications to treat her symptoms.        Festus Barren, MD  09/20/2022 11:16 AM    This note was created with Dragon medical transcription system.  Any errors from dictation are purely unintentional

## 2022-11-02 ENCOUNTER — Encounter (INDEPENDENT_AMBULATORY_CARE_PROVIDER_SITE_OTHER): Payer: Self-pay | Admitting: Vascular Surgery

## 2022-11-02 ENCOUNTER — Ambulatory Visit (INDEPENDENT_AMBULATORY_CARE_PROVIDER_SITE_OTHER): Payer: BC Managed Care – PPO | Admitting: Vascular Surgery

## 2022-11-02 VITALS — BP 126/87 | HR 67 | Resp 18 | Ht 74.0 in | Wt 271.6 lb

## 2022-11-02 DIAGNOSIS — I83891 Varicose veins of right lower extremities with other complications: Secondary | ICD-10-CM

## 2022-11-02 NOTE — Progress Notes (Signed)
Dennis Gardner is a 42 y.o.male who presents with painful varicose veins of the right leg  Past Medical History:  Diagnosis Date   Asthma    Thyroid disease     Past Surgical History:  Procedure Laterality Date   COLONOSCOPY WITH PROPOFOL N/A 06/24/2017   Procedure: COLONOSCOPY WITH PROPOFOL;  Surgeon: Wyline Mood, MD;  Location: [facility] ENDOSCOPY;  Service: Gastroenterology;  Laterality: N/A;   parathyroid excision     PARATHYROIDECTOMY      Current Outpatient Medications  Medication Sig Dispense Refill   clomiPHENE (CLOMID) 50 MG tablet Take 0.5 tablets (25 mg total) by mouth daily. 30 tablet 0   tirzepatide (ZEPBOUND) 2.5 MG/0.5ML Pen Inject 2.5 mg into the skin once a week. 2 mL 0   traZODone (DESYREL) 50 MG tablet Take 0.5-1 tablets (25-50 mg total) by mouth at bedtime as needed for sleep. 30 tablet 3   No current facility-administered medications for this visit.    No Known Allergies  Indication: Patient presents with symptomatic varicose veins of the right lower extremity.  Procedure: Foam sclerotherapy was performed on the right lower extremity. Using ultrasound guidance, 5 mL of foam Sotradecol was used to inject the varicosities of the right lower extremity. Compression wraps were placed. The patient tolerated the procedure well.

## 2022-11-30 ENCOUNTER — Ambulatory Visit (INDEPENDENT_AMBULATORY_CARE_PROVIDER_SITE_OTHER): Payer: BC Managed Care – PPO | Admitting: Vascular Surgery

## 2022-12-24 ENCOUNTER — Ambulatory Visit (INDEPENDENT_AMBULATORY_CARE_PROVIDER_SITE_OTHER): Payer: BC Managed Care – PPO | Admitting: Vascular Surgery

## 2022-12-28 ENCOUNTER — Ambulatory Visit (INDEPENDENT_AMBULATORY_CARE_PROVIDER_SITE_OTHER): Payer: BC Managed Care – PPO | Admitting: Vascular Surgery

## 2023-01-24 ENCOUNTER — Telehealth: Payer: Self-pay

## 2023-01-24 ENCOUNTER — Other Ambulatory Visit: Payer: Self-pay | Admitting: Family Medicine

## 2023-01-24 DIAGNOSIS — E7849 Other hyperlipidemia: Secondary | ICD-10-CM

## 2023-01-24 NOTE — Telephone Encounter (Signed)
Pt requested calcium CT  Imaging ordered   [name], MD

## 2023-01-24 NOTE — Telephone Encounter (Signed)
Patient notified and verbalized appreciation.

## 2023-01-24 NOTE — Telephone Encounter (Signed)
Copied from CRM [id number]. Topic: Referral - Request for Referral >> Jan 21, 2023  9:35 AM Phill Myron wrote: Pt. Dennis Gardner is requesting a referral for a Calcium scoring test ..please advise

## 2023-01-25 ENCOUNTER — Ambulatory Visit (INDEPENDENT_AMBULATORY_CARE_PROVIDER_SITE_OTHER): Payer: BC Managed Care – PPO | Admitting: Vascular Surgery

## 2023-01-25 ENCOUNTER — Ambulatory Visit
Admission: RE | Admit: 2023-01-25 | Discharge: 2023-01-25 | Disposition: A | Discharge status: Home / Self Care | Payer: Self-pay | Source: Ambulatory Visit | Attending: Family Medicine | Admitting: Family Medicine

## 2023-01-25 DIAGNOSIS — E7849 Other hyperlipidemia: Secondary | ICD-10-CM | POA: Insufficient documentation

## 2023-02-08 ENCOUNTER — Encounter: Payer: Self-pay | Admitting: Family Medicine

## 2023-02-08 ENCOUNTER — Ambulatory Visit (INDEPENDENT_AMBULATORY_CARE_PROVIDER_SITE_OTHER): Payer: BC Managed Care – PPO | Admitting: Family Medicine

## 2023-02-08 VITALS — BP 128/74 | HR 69 | Resp 18 | Ht 74.0 in | Wt 279.0 lb

## 2023-02-08 DIAGNOSIS — Z6835 Body mass index (BMI) 35.0-35.9, adult: Secondary | ICD-10-CM | POA: Diagnosis not present

## 2023-02-08 DIAGNOSIS — Z131 Encounter for screening for diabetes mellitus: Secondary | ICD-10-CM

## 2023-02-08 DIAGNOSIS — E7849 Other hyperlipidemia: Secondary | ICD-10-CM | POA: Diagnosis not present

## 2023-02-08 DIAGNOSIS — Z0000 Encounter for general adult medical examination without abnormal findings: Secondary | ICD-10-CM | POA: Diagnosis not present

## 2023-02-08 DIAGNOSIS — I10 Essential (primary) hypertension: Secondary | ICD-10-CM | POA: Diagnosis not present

## 2023-02-08 DIAGNOSIS — G4733 Obstructive sleep apnea (adult) (pediatric): Secondary | ICD-10-CM | POA: Diagnosis not present

## 2023-02-08 DIAGNOSIS — G4709 Other insomnia: Secondary | ICD-10-CM

## 2023-02-08 DIAGNOSIS — Z130 Encounter for screening for diseases of the blood and blood-forming organs and certain disorders involving the immune mechanism: Secondary | ICD-10-CM

## 2023-02-08 MED ORDER — PHENTERMINE HCL 15 MG PO CAPS
15.0000 mg | ORAL_CAPSULE | ORAL | 0 refills | Status: AC
Start: 2023-02-08 — End: ?

## 2023-02-08 NOTE — Assessment & Plan Note (Addendum)
Considering in the context of obesity and potential use of phentermine, which can increase blood pressure.   BP within normal limits  - Monitor blood pressure regularly while on phentermine   -BP within normal limits today  -no current medications prescribed for BP

## 2023-02-08 NOTE — Assessment & Plan Note (Signed)
Consistently elevated cholesterol. Not currently on any medication. Recent heart scan showed a calcium score of zero, unexpectedly low given history of smoking and poor diet.   - Order lipid panel   - Recheck cholesterol levels today  -The 10-year ASCVD risk score (Arnett DK, et al., 2019) is: 7.5% - will likely need cholesterol lowering agent pending results of lipid panel - pt will return for fasting labs

## 2023-02-08 NOTE — Patient Instructions (Addendum)
It was a pleasure to see you today!  Thank you for choosing [facility] for your primary care.   Today you were seen for your annual physical  Please review the attached information regarding helpful preventive health topics.   - Please start phentermine 15mg  daily  To keep you healthy, please keep in mind the following health maintenance items that you are due for:   1.Influenza vaccine  2. Tetanus vaccine booster 3. Pneumococcal vaccine   Please make sure to schedule your next annual physical for one year from today.   Please see me in 1 month for weight follow up   Best Wishes,   Dr. Roxan Hockey

## 2023-02-08 NOTE — Assessment & Plan Note (Signed)
Obstructive Sleep Apnea (OSA) , chronic  Not well controlled  Using CPAP machine for about a year but continues to struggle with its use, experiencing fatigue and lack of energy. Has not seen a sleep medicine specialist since starting CPAP.   - will consider referral  to sleep medicine for further evaluation and management of OSA  given no improvement with machine, concerned he may not be able to tolerate the CPAP

## 2023-02-08 NOTE — Assessment & Plan Note (Signed)
Chronic conditions are stable  Patient was counseled on benefits of regular physical activity with goal of 150 minutes of moderate to vigurous intensity 4 days per week  Patient was counseled to consume well balanced diet of fruits, vegetables, limited saturated fats and limited sugary foods and beverages with emphasis on consuming 6-8 glasses of water daily  Screening recommended today: A1c, lipids,CMP,CBC  Colon cancer screening: age <45   Lung CA screening CT: not recommended at this time   Vaccines recommended today: Influenza,Tetanus booster, Prevnar

## 2023-02-08 NOTE — Assessment & Plan Note (Signed)
BMI of 35.82 categorizes him as obese. History of weight gain over eight years, struggling with energy levels and sleep quality. Previous attempts to start Ozempic were unsuccessful due to insurance denial. Discussed potential use of phentermine as a short-term weight management option. Risks include increased blood pressure and heart rate, and potential for weight regain after discontinuation. Goal is to reduce weight by at least 5%.   - Start phentermine 15 mg once daily   - Follow up in one month, plan to titrate up to 30mg  if pt tolerates initial dose well  -BMP and CBC ordered today  - counseled patient to monitor BP and HR while on this medication  -PDMP reviewed today, no recent controlled substances prescribed

## 2023-02-08 NOTE — Progress Notes (Signed)
Complete physical exam   Patient: Dennis Gardner   DOB: [birth year]   42 y.o. Male  MRN: [id number] Visit Date: 02/08/2023  Today's healthcare provider: [name], MD   Chief Complaint  Patient presents with  . Annual Exam   Subjective    Dennis Gardner is a 42 y.o. male who presents today for a complete physical exam.   He reports consuming a general diet.    Plays softball a few times per week but exercise is limited by feeling tired and traveling for work     He generally feels fairly well.   He reports sleeping poorly.    He does have additional problems to discuss today.   Discussed the use of AI scribe software for clinical note transcription with the patient, who gave verbal consent to proceed.  History of Present Illness   The patient, a 42 year old with a history of obstructive sleep apnea (OSA), hypercholesterolemia, and obesity, presents for an annual physical. He reports struggling with his CPAP machine, often finding it removed in the middle of the night, possibly self-removed unknowingly. Over the past two months, he has experienced significant fatigue, lack of energy, and difficulty focusing, despite early bedtimes. This has impacted his work, which involves travel, and his home life.  Despite these challenges, the patient has made some dietary changes and has lost a few pounds. He recently had a heart scan, which surprisingly showed a score of zero despite a history of smoking and poor eating habits. He has a family history of diabetes and high cholesterol, which has been consistently high.  The patient is not currently on any cholesterol-lowering medication. He engages in softball twice a week and plans to start playing pickleball. However, he admits that his travel schedule and fatigue often limit his exercise opportunities. Over the past eight years, he has noticed a significant increase in his weight.  The patient has previously attempted to get  approval for weight management medication through his insurance, but it was denied as a 'cosmetic' treatment. He expresses concern about his overall health, particularly his heart health and mental well-being, given his lack of sleep and increased weight. He is interested in exploring medication options for weight management.      Using CPAP machine for sleep apnea, having less improvement with feeling tired the next day, impacting his energy    Past Medical History:  Diagnosis Date  . Asthma   . Thyroid disease    Past Surgical History:  Procedure Laterality Date  . COLONOSCOPY WITH PROPOFOL N/A 06/24/2017   Procedure: COLONOSCOPY WITH PROPOFOL;  Surgeon: Wyline Mood, MD;  Location: [facility] ENDOSCOPY;  Service: Gastroenterology;  Laterality: N/A;  . parathyroid excision    . PARATHYROIDECTOMY     Social History   Socioeconomic History  . Marital status: Married    Spouse name: Not on file  . Number of children: Not on file  . Years of education: Not on file  . Highest education level: Not on file  Occupational History  . Not on file  Tobacco Use  . Smoking status: Some Days  . Smokeless tobacco: Never  . Tobacco comments:    very social, maybe 3 a month   Vaping Use  . Vaping status: Never Used  Substance and Sexual Activity  . Alcohol use: Yes  . Drug use: No  . Sexual activity: Yes  Other Topics Concern  . Not on file  Social History Narrative  . Not on file  Social Determinants of Health   Financial Resource Strain: Not on file  Food Insecurity: Not on file  Transportation Needs: Not on file  Physical Activity: Not on file  Stress: Not on file  Social Connections: Not on file  Intimate Partner Violence: Not on file   Family Status  Relation Name Status  . Mother  Alive  . Father  Alive  No partnership data on file   Family History  Problem Relation Age of Onset  . Hypertension Mother   . Diabetes Father    No Known Allergies    Medications: Outpatient Medications Prior to Visit  Medication Sig  . traZODone (DESYREL) 50 MG tablet Take 0.5-1 tablets (25-50 mg total) by mouth at bedtime as needed for sleep.  . [DISCONTINUED] clomiPHENE (CLOMID) 50 MG tablet Take 0.5 tablets (25 mg total) by mouth daily.  . [DISCONTINUED] tirzepatide (ZEPBOUND) 2.5 MG/0.5ML Pen Inject 2.5 mg into the skin once a week.   No facility-administered medications prior to visit.    Review of Systems  Last CBC Lab Results  Component Value Date   WBC 6.7 08/07/2021   HGB 16.1 08/07/2021   HCT 46.7 08/07/2021   MCV 81 08/07/2021   MCH 28.0 08/07/2021   RDW 13.0 08/07/2021   PLT 272 08/07/2021   Last metabolic panel Lab Results  Component Value Date   GLUCOSE 100 (H) 08/07/2021   NA 138 08/07/2021   K 4.5 08/07/2021   CL 101 08/07/2021   CO2 23 08/07/2021   BUN 17 08/07/2021   CREATININE 1.02 08/07/2021   EGFR 96 08/07/2021   CALCIUM 9.4 08/07/2021   PROT 7.4 08/07/2021   ALBUMIN 4.7 06/11/2022   LABGLOB 3.0 08/07/2021   AGRATIO 1.5 08/07/2021   BILITOT 0.6 08/07/2021   ALKPHOS 77 08/07/2021   AST 20 08/07/2021   ALT 34 08/07/2021   ANIONGAP 10 04/06/2017   Last lipids Lab Results  Component Value Date   CHOL 217 (H) 08/07/2021   HDL 34 (L) 08/07/2021   LDLCALC 143 (H) 08/07/2021   TRIG 218 (H) 08/07/2021   CHOLHDL 6.4 (H) 08/07/2021   The 10-year ASCVD risk score (Arnett DK, et al., 2019) is: 7.5%   Last hemoglobin A1c Lab Results  Component Value Date   HGBA1C 5.2 08/07/2021   Last thyroid functions Lab Results  Component Value Date   TSH 1.740 08/07/2021       Objective    BP 128/74   Pulse 69   Resp 18   Ht 6\' 2"  (1.88 m)   Wt 279 lb (126.6 kg)   SpO2 98%   BMI 35.82 kg/m  BP Readings from Last 3 Encounters:  02/08/23 128/74  11/02/22 126/87  09/17/22 136/78   Wt Readings from Last 3 Encounters:  02/08/23 279 lb (126.6 kg)  11/02/22 271 lb 9.6 oz (123.2 kg)  09/17/22 274 lb  12.8 oz (124.6 kg)        Physical Exam Vitals reviewed.  Constitutional:      General: He is not in acute distress.    Appearance: Normal appearance. He is not ill-appearing, toxic-appearing or diaphoretic.  HENT:     Head: Normocephalic and atraumatic.     Right Ear: Tympanic membrane and external ear normal.     Left Ear: Tympanic membrane and external ear normal.     Nose: Nose normal.     Mouth/Throat:     Mouth: Mucous membranes are moist.     Pharynx: No oropharyngeal  exudate or posterior oropharyngeal erythema.  Eyes:     General: No scleral icterus.    Extraocular Movements: Extraocular movements intact.     Conjunctiva/sclera: Conjunctivae normal.     Pupils: Pupils are equal, round, and reactive to light.  Neck:     Vascular: No carotid bruit.  Cardiovascular:     Rate and Rhythm: Normal rate and regular rhythm.     Pulses: Normal pulses.     Heart sounds: Normal heart sounds. No murmur heard.    No friction rub. No gallop.  Pulmonary:     Effort: Pulmonary effort is normal. No respiratory distress.     Breath sounds: Normal breath sounds. No stridor. No wheezing, rhonchi or rales.  Abdominal:     General: Bowel sounds are normal. There is no distension.     Palpations: Abdomen is soft.     Tenderness: There is no abdominal tenderness.  Musculoskeletal:        General: No swelling, tenderness or signs of injury. Normal range of motion.     Cervical back: Normal range of motion and neck supple. No rigidity or tenderness.     Right lower leg: No edema.     Left lower leg: No edema.  Lymphadenopathy:     Cervical: No cervical adenopathy.  Skin:    General: Skin is warm and dry.     Capillary Refill: Capillary refill takes less than 2 seconds.     Findings: No erythema or rash.  Neurological:     General: No focal deficit present.     Mental Status: He is alert and oriented to person, place, and time.     Cranial Nerves: Cranial nerves 2-12 are intact.      Sensory: Sensation is intact.     Motor: Motor function is intact. No weakness, tremor or abnormal muscle tone.     Gait: Gait normal.  Psychiatric:        Attention and Perception: Attention normal.        Mood and Affect: Mood normal.        Speech: Speech normal.        Behavior: Behavior normal. Behavior is cooperative.        Thought Content: Thought content normal.     Last depression screening scores    02/08/2023    9:25 AM 08/06/2021    3:36 PM  PHQ 2/9 Scores  PHQ - 2 Score 0 0  PHQ- 9 Score 9 9    Last fall risk screening    02/08/2023    9:26 AM  Fall Risk   Falls in the past year? 0  Number falls in past yr: 0  Injury with Fall? 0    Last Audit-C alcohol use screening    08/06/2021    3:36 PM  Alcohol Use Disorder Test (AUDIT)  1. How often do you have a drink containing alcohol? 1  2. How many drinks containing alcohol do you have on a typical day when you are drinking? 0  3. How often do you have six or more drinks on one occasion? 0  AUDIT-C Score 1   A score of 3 or more in women, and 4 or more in men indicates increased risk for alcohol abuse, EXCEPT if all of the points are from question 1   No results found for any visits on 02/08/23.  Assessment & Plan    Routine Health Maintenance and Physical Exam  Immunization History  Administered Date(s)  Administered  . Influenza,inj,Quad PF,6+ Mos 01/13/2018, 01/13/2022    Health Maintenance  Topic Date Due  . Pneumococcal Vaccine 69-63 Years old (1 of 2 - PCV) Never done  . DTaP/Tdap/Td (1 - Tdap) Never done  . COVID-19 Vaccine (1 - 2023-24 season) Never done  . INFLUENZA VACCINE  05/30/2023 (Originally 09/30/2022)  . Hepatitis C Screening  Completed  . HIV Screening  Completed  . HPV VACCINES  Aged Out    Problem List Items Addressed This Visit       Cardiovascular and Mediastinum   Primary hypertension    Considering in the context of obesity and potential use of phentermine, which can  increase blood pressure.   BP within normal limits  - Monitor blood pressure regularly while on phentermine   -BP within normal limits today  -no current medications prescribed for BP        Relevant Orders   BMP8+EGFR     Respiratory   OSA (obstructive sleep apnea)    Obstructive Sleep Apnea (OSA) , chronic  Not well controlled  Using CPAP machine for about a year but continues to struggle with its use, experiencing fatigue and lack of energy. Has not seen a sleep medicine specialist since starting CPAP.   - will consider referral  to sleep medicine for further evaluation and management of OSA  given no improvement with machine, concerned he may not be able to tolerate the CPAP      Relevant Orders   CBC     Other   Severe obesity (BMI 35.0-35.9 with comorbidity) (HCC)    BMI of 35.82 categorizes him as obese. History of weight gain over eight years, struggling with energy levels and sleep quality. Previous attempts to start Ozempic were unsuccessful due to insurance denial. Discussed potential use of phentermine as a short-term weight management option. Risks include increased blood pressure and heart rate, and potential for weight regain after discontinuation. Goal is to reduce weight by at least 5%.   - Start phentermine 15 mg once daily   - Follow up in one month, plan to titrate up to 30mg  if pt tolerates initial dose well  -BMP and CBC ordered today  - counseled patient to monitor BP and HR while on this medication  -PDMP reviewed today, no recent controlled substances prescribed       Relevant Medications   phentermine 15 MG capsule   Other hyperlipidemia    Consistently elevated cholesterol. Not currently on any medication. Recent heart scan showed a calcium score of zero, unexpectedly low given history of smoking and poor diet.   - Order lipid panel   - Recheck cholesterol levels today  -The 10-year ASCVD risk score (Arnett DK, et al., 2019) is: 7.5% - will likely  need cholesterol lowering agent pending results of lipid panel - pt will return for fasting labs       Relevant Orders   Lipid panel   Insomnia   Annual physical exam - Primary    Chronic conditions are stable  Patient was counseled on benefits of regular physical activity with goal of 150 minutes of moderate to vigurous intensity 4 days per week  Patient was counseled to consume well balanced diet of fruits, vegetables, limited saturated fats and limited sugary foods and beverages with emphasis on consuming 6-8 glasses of water daily  Screening recommended today: A1c, lipids,CMP,CBC  Colon cancer screening: age <45   Lung CA screening CT: not recommended at this time  Vaccines recommended today: Influenza,Tetanus booster, Prevnar        Other Visit Diagnoses     Screening for diabetes mellitus       Relevant Orders   Hemoglobin A1c   Screening for deficiency anemia       Relevant Orders   CBC          Return in about 1 month (around 03/11/2023) for Weight MGMT.       [name], MD  [facility] [phone] (phone) [phone] (fax)  [facility]

## 2023-03-11 ENCOUNTER — Ambulatory Visit: Payer: BC Managed Care – PPO | Admitting: Family Medicine

## 2023-03-11 NOTE — Progress Notes (Deleted)
      Established patient visit   Patient: Dennis Gardner   DOB: 1980/12/31   43 y.o. Male  MRN: [id number] Visit Date: 03/11/2023  Today's healthcare provider: Rockie Agent, MD   No chief complaint on file.  Subjective       Discussed the use of AI scribe software for clinical note transcription with the patient, who gave verbal consent to proceed.  History of Present Illness             Past Medical History:  Diagnosis Date   Asthma    Thyroid disease     Medications: Outpatient Medications Prior to Visit  Medication Sig   phentermine  15 MG capsule Take 1 capsule (15 mg total) by mouth every morning.   traZODone  (DESYREL ) 50 MG tablet Take 0.5-1 tablets (25-50 mg total) by mouth at bedtime as needed for sleep.   No facility-administered medications prior to visit.    Review of Systems  {Insert previous labs (optional):23779} {See past labs  Heme  Chem  Endocrine  Serology  Results Review (optional):1}   Objective    There were no vitals taken for this visit. {Insert last BP/Wt (optional):23777}{See vitals history (optional):1}    Physical Exam  ***  No results found for any visits on 03/11/23.  Assessment & Plan     Problem List Items Addressed This Visit   None   Assessment and Plan              No follow-ups on file.         Rockie Agent, MD  [facility] [phone] (phone) [phone] (fax)  [facility]

## 2023-03-11 NOTE — Assessment & Plan Note (Deleted)
 BMI of 35.82 ** . Previous attempts to start Ozempic were unsuccessful due to insurance denial.  - increase  phentermine to 30 mg once daily

## 2023-07-19 ENCOUNTER — Encounter (INDEPENDENT_AMBULATORY_CARE_PROVIDER_SITE_OTHER): Payer: Self-pay

## 2023-08-25 DIAGNOSIS — G4733 Obstructive sleep apnea (adult) (pediatric): Secondary | ICD-10-CM | POA: Diagnosis not present

## 2023-08-25 DIAGNOSIS — G4710 Hypersomnia, unspecified: Secondary | ICD-10-CM | POA: Diagnosis not present

## 2023-09-12 DIAGNOSIS — R300 Dysuria: Secondary | ICD-10-CM | POA: Diagnosis not present

## 2023-09-13 ENCOUNTER — Ambulatory Visit: Admitting: Internal Medicine

## 2023-09-14 ENCOUNTER — Other Ambulatory Visit (HOSPITAL_COMMUNITY)
Admission: RE | Admit: 2023-09-14 | Discharge: 2023-09-14 | Disposition: A | Discharge status: Home / Self Care | Source: Ambulatory Visit | Attending: Internal Medicine | Admitting: Internal Medicine

## 2023-09-14 ENCOUNTER — Other Ambulatory Visit: Payer: Self-pay | Admitting: Internal Medicine

## 2023-09-14 ENCOUNTER — Encounter: Payer: Self-pay | Admitting: Internal Medicine

## 2023-09-14 ENCOUNTER — Other Ambulatory Visit: Payer: Self-pay

## 2023-09-14 ENCOUNTER — Ambulatory Visit: Admitting: Internal Medicine

## 2023-09-14 VITALS — BP 124/72 | HR 68 | Temp 97.9°F | Resp 18 | Ht 74.0 in | Wt 298.7 lb

## 2023-09-14 DIAGNOSIS — R350 Frequency of micturition: Secondary | ICD-10-CM | POA: Diagnosis not present

## 2023-09-14 DIAGNOSIS — R079 Chest pain, unspecified: Secondary | ICD-10-CM | POA: Diagnosis not present

## 2023-09-14 DIAGNOSIS — R300 Dysuria: Secondary | ICD-10-CM | POA: Diagnosis not present

## 2023-09-14 DIAGNOSIS — Z113 Encounter for screening for infections with a predominantly sexual mode of transmission: Secondary | ICD-10-CM | POA: Diagnosis not present

## 2023-09-14 DIAGNOSIS — Z13220 Encounter for screening for lipoid disorders: Secondary | ICD-10-CM

## 2023-09-14 LAB — CBC WITH DIFFERENTIAL/PLATELET
Absolute Lymphocytes: 1958 {cells}/uL (ref 850–3900)
Absolute Monocytes: 518 {cells}/uL (ref 200–950)
Basophils Absolute: 38 {cells}/uL (ref 0–200)
Basophils Relative: 0.6 %
Eosinophils Absolute: 51 {cells}/uL (ref 15–500)
Eosinophils Relative: 0.8 %
HCT: 48.7 % (ref 38.5–50.0)
Hemoglobin: 16.2 g/dL (ref 13.2–17.1)
MCH: 28.4 pg (ref 27.0–33.0)
MCHC: 33.3 g/dL (ref 32.0–36.0)
MCV: 85.4 fL (ref 80.0–100.0)
MPV: 8.7 fL (ref 7.5–12.5)
Monocytes Relative: 8.1 %
Neutro Abs: 3834 {cells}/uL (ref 1500–7800)
Neutrophils Relative %: 59.9 %
Platelets: 237 Thousand/uL (ref 140–400)
RBC: 5.7 Million/uL (ref 4.20–5.80)
RDW: 13.1 % (ref 11.0–15.0)
Total Lymphocyte: 30.6 %
WBC: 6.4 Thousand/uL (ref 3.8–10.8)

## 2023-09-14 LAB — COMPREHENSIVE METABOLIC PANEL WITH GFR
AG Ratio: 1.5 (calc) (ref 1.0–2.5)
ALT: 26 U/L (ref 9–46)
AST: 19 U/L (ref 10–40)
Albumin: 4.6 g/dL (ref 3.6–5.1)
Alkaline phosphatase (APISO): 67 U/L (ref 36–130)
BUN: 15 mg/dL (ref 7–25)
CO2: 28 mmol/L (ref 20–32)
Calcium: 9.8 mg/dL (ref 8.6–10.3)
Chloride: 102 mmol/L (ref 98–110)
Creat: 1.11 mg/dL (ref 0.60–1.29)
Globulin: 3.1 g/dL (ref 1.9–3.7)
Glucose, Bld: 88 mg/dL (ref 65–99)
Potassium: 4.2 mmol/L (ref 3.5–5.3)
Sodium: 138 mmol/L (ref 135–146)
Total Bilirubin: 0.7 mg/dL (ref 0.2–1.2)
Total Protein: 7.7 g/dL (ref 6.1–8.1)
eGFR: 86 mL/min/1.73m2 (ref >=60)

## 2023-09-14 LAB — LIPID PANEL
Cholesterol: 227 mg/dL — ABNORMAL HIGH (ref <200)
HDL: 44 mg/dL (ref >=40)
LDL Cholesterol (Calc): 150 mg/dL — ABNORMAL HIGH
Non-HDL Cholesterol (Calc): 183 mg/dL — ABNORMAL HIGH (ref <130)
Total CHOL/HDL Ratio: 5.2 (calc) — ABNORMAL HIGH (ref <5.0)
Triglycerides: 192 mg/dL — ABNORMAL HIGH (ref <150)

## 2023-09-14 LAB — POCT URINALYSIS DIPSTICK
Bilirubin, UA: NEGATIVE
Glucose, UA: NEGATIVE
Ketones, UA: NEGATIVE
Nitrite, UA: NEGATIVE
Protein, UA: POSITIVE — AB
Spec Grav, UA: 1.02 (ref 1.010–1.025)
Urobilinogen, UA: 0.2 U/dL
pH, UA: 5 (ref 5.0–8.0)

## 2023-09-14 MED ORDER — SULFAMETHOXAZOLE-TRIMETHOPRIM 800-160 MG PO TABS: 1.0000 | ORAL_TABLET | Freq: Two times a day (BID) | ORAL | 0 refills | Status: AC

## 2023-09-14 NOTE — Progress Notes (Signed)
 Acute Office Visit  Subjective:     Patient ID: Dennis Gardner, male    DOB: [birth year], 43 y.o.   MRN: [id number]  Chief Complaint  Patient presents with   Urinary Frequency    Burning for 10 days    Urinary Frequency  Associated symptoms include frequency and urgency. Pertinent negatives include no chills, flank pain or hematuria.   Patient is in today for dysuria and urinary frequency. He is a [facility] patient and this is my first time I am meeting him.   Discussed the use of AI scribe software for clinical note transcription with the patient, who gave verbal consent to proceed.  History of Present Illness Dennis Gardner is a 43 year old male who presents with urinary discomfort and frequent urination.  He experiences a constant burning sensation at the head of the penis for two weeks, with frequent and urgent urination. There is no lower pelvic or abdominal pain, fever, or skin changes. Hematuria is present. A urine sample shows microscopic blood, protein, and leukocytes, but is negative for nitrates. He has been doing strenuous outdoor work and suspects dehydration may contribute to his symptoms. Increasing fluid intake with drinks like Gatorade and Propel provides some relief. He denies new sexual partners or concerns about sexually transmitted diseases. He is not taking medications that could increase the risk of a UTI.  Patient also complaining of one episode of chest tightness about 2 days ago. Patient was doing a lot of physical activity in his garage, was lifting heavy items and sweating. Pain was across chest wall and lasted about 3-5 hours and was reproducible. No associated symptoms, eventually resolved and have not reoccurred.     Review of Systems  Constitutional:  Negative for chills and fever.  Respiratory:  Negative for shortness of breath.   Cardiovascular:  Positive for chest pain. Negative for palpitations.  Gastrointestinal:  Negative for abdominal pain.  Genitourinary:   Positive for dysuria, frequency and urgency. Negative for flank pain and hematuria.        Objective:    BP 124/72 (Cuff Size: Large)   Pulse 68   Temp 97.9 F (36.6 C) (Oral)   Resp 18   Ht 6' 2 (1.88 m)   Wt 298 lb 11.2 oz (135.5 kg)   SpO2 98%   BMI 38.35 kg/m  BP Readings from Last 3 Encounters:  09/14/23 124/72  02/08/23 128/74  11/02/22 126/87   Wt Readings from Last 3 Encounters:  09/14/23 298 lb 11.2 oz (135.5 kg)  02/08/23 279 lb (126.6 kg)  11/02/22 271 lb 9.6 oz (123.2 kg)      Physical Exam Constitutional:      Appearance: Normal appearance.  HENT:     Head: Normocephalic and atraumatic.  Eyes:     Conjunctiva/sclera: Conjunctivae normal.  Cardiovascular:     Rate and Rhythm: Normal rate and regular rhythm.  Pulmonary:     Effort: Pulmonary effort is normal.     Breath sounds: Normal breath sounds.  Abdominal:     Tenderness: There is no right CVA tenderness or left CVA tenderness.  Skin:    General: Skin is warm and dry.  Neurological:     General: No focal deficit present.     Mental Status: He is alert. Mental status is at baseline.  Psychiatric:        Mood and Affect: Mood normal.        Behavior: Behavior normal.     No results  found for any visits on 09/14/23.      Assessment & Plan:   Assessment & Plan Dysuria and Frequent Urination Microscopic hematuria, proteinuria, and leukocytes suggest inflammation. Empirical antibiotics considered while awaiting culture results. Low likelihood of kidney stones. Strenuous activity in heat may cause rhabdomyolysis. Increased fluid and electrolytes advised. - Order urine culture. - Prescribe antibiotics for possible UTI. - Encourage increased fluid intake with electrolytes. - Order creatinine kinase (CK) test.  Chest Pain Chest tightness post-activity suggests musculoskeletal origin. ECG needed to rule out cardiac causes. - Perform ECG to rule out cardiac causes - EKG showing sinus  bradycardia, HR 59.   Obesity and Sleep Apnea Severe sleep apnea managed with CPAP. Interested in GLP-1 receptor agonists for weight loss. Insurance coverage to be explored. - Order comprehensive blood work including lipid panel and CBC. - Explore insurance coverage for GLP-1 receptor agonists. - Coordinate with primary care provider for continuity of care.  General Health Maintenance Routine blood work planned to assess overall health and support weight loss interventions. - Order routine blood work including kidney and liver function tests, electrolytes, and lipid panel.  - POCT urinalysis dipstick - Urine Culture - Urine cytology ancillary only - CK; Future - Comprehensive Metabolic Panel (CMET) - CBC w/Diff/Platelet - sulfamethoxazole -trimethoprim  (BACTRIM  DS) 800-160 MG tablet; Take 1 tablet by mouth 2 (two) times daily for 7 days.  Dispense: 14 tablet; Refill: 0 - EKG 12-Lead - Lipid Profile   Return if symptoms worsen or fail to improve.  Sharyle Fischer, DO

## 2023-09-15 ENCOUNTER — Ambulatory Visit: Payer: Self-pay | Admitting: Internal Medicine

## 2023-09-15 LAB — URINE CULTURE
MICRO NUMBER:: 16709578
Result:: NO GROWTH
SPECIMEN QUALITY:: ADEQUATE

## 2023-09-15 LAB — URINE CYTOLOGY ANCILLARY ONLY
Chlamydia: NEGATIVE
Comment: NEGATIVE
Comment: NEGATIVE
Comment: NORMAL
Neisseria Gonorrhea: NEGATIVE
Trichomonas: NEGATIVE

## 2023-09-16 ENCOUNTER — Ambulatory Visit: Payer: Self-pay | Admitting: Internal Medicine

## 2023-09-24 DIAGNOSIS — G4710 Hypersomnia, unspecified: Secondary | ICD-10-CM | POA: Diagnosis not present

## 2023-09-24 DIAGNOSIS — G4733 Obstructive sleep apnea (adult) (pediatric): Secondary | ICD-10-CM | POA: Diagnosis not present

## 2023-09-30 DIAGNOSIS — M94262 Chondromalacia, left knee: Secondary | ICD-10-CM | POA: Diagnosis not present

## 2023-10-04 ENCOUNTER — Telehealth: Payer: Self-pay

## 2023-10-04 NOTE — Telephone Encounter (Signed)
 Copied from CRM #[id number]. Topic: Clinical - Medication Question >> Oct 03, 2023  5:03 PM Sophia H wrote: Reason for CRM: Patient was seen by another provider and had some lab work done, wanting to know if any weight loss drugs can be prescribed given those results. Please advise # [phone]

## 2023-10-04 NOTE — Telephone Encounter (Signed)
 Called and was able to get the pt scheduled for a face to face visit to discuss weight Loss

## 2023-10-28 DIAGNOSIS — M2392 Unspecified internal derangement of left knee: Secondary | ICD-10-CM | POA: Diagnosis not present

## 2023-11-09 ENCOUNTER — Ambulatory Visit: Admitting: Family Medicine

## 2023-11-21 DIAGNOSIS — M2392 Unspecified internal derangement of left knee: Secondary | ICD-10-CM | POA: Diagnosis not present

## 2023-11-29 DIAGNOSIS — S83282A Other tear of lateral meniscus, current injury, left knee, initial encounter: Secondary | ICD-10-CM | POA: Diagnosis not present

## 2023-12-08 DIAGNOSIS — G4710 Hypersomnia, unspecified: Secondary | ICD-10-CM | POA: Diagnosis not present

## 2024-01-08 DIAGNOSIS — G4710 Hypersomnia, unspecified: Secondary | ICD-10-CM | POA: Diagnosis not present

## 2024-01-08 DIAGNOSIS — G4733 Obstructive sleep apnea (adult) (pediatric): Secondary | ICD-10-CM | POA: Diagnosis not present

## 2024-02-05 DIAGNOSIS — M5416 Radiculopathy, lumbar region: Secondary | ICD-10-CM | POA: Diagnosis not present

## 2024-02-07 DIAGNOSIS — G4710 Hypersomnia, unspecified: Secondary | ICD-10-CM | POA: Diagnosis not present

## 2024-02-07 DIAGNOSIS — G4733 Obstructive sleep apnea (adult) (pediatric): Secondary | ICD-10-CM | POA: Diagnosis not present

## 2024-02-17 DIAGNOSIS — L918 Other hypertrophic disorders of the skin: Secondary | ICD-10-CM | POA: Diagnosis not present

## 2024-02-17 DIAGNOSIS — D485 Neoplasm of uncertain behavior of skin: Secondary | ICD-10-CM | POA: Diagnosis not present

## 2024-02-17 DIAGNOSIS — D225 Melanocytic nevi of trunk: Secondary | ICD-10-CM | POA: Diagnosis not present

## 2024-02-17 DIAGNOSIS — D229 Melanocytic nevi, unspecified: Secondary | ICD-10-CM | POA: Diagnosis not present

## 2024-02-29 DIAGNOSIS — M5451 Vertebrogenic low back pain: Secondary | ICD-10-CM | POA: Diagnosis not present
